# Patient Record
Sex: Female | Born: 1940 | Race: White | Hispanic: No | Smoking: Former smoker
Health system: Southern US, Community
[De-identification: ages and names within clinical notes are randomized; demographics above are authoritative.]

## PROBLEM LIST (undated history)

## (undated) DIAGNOSIS — E785 Hyperlipidemia, unspecified: Secondary | ICD-10-CM

## (undated) DIAGNOSIS — J449 Chronic obstructive pulmonary disease, unspecified: Secondary | ICD-10-CM

## (undated) DIAGNOSIS — E079 Disorder of thyroid, unspecified: Secondary | ICD-10-CM

## (undated) DIAGNOSIS — I10 Essential (primary) hypertension: Secondary | ICD-10-CM

## (undated) DIAGNOSIS — J189 Pneumonia, unspecified organism: Secondary | ICD-10-CM

## (undated) DIAGNOSIS — Z860100 Personal history of colon polyps, unspecified: Secondary | ICD-10-CM

## (undated) DIAGNOSIS — R600 Localized edema: Secondary | ICD-10-CM

## (undated) DIAGNOSIS — I219 Acute myocardial infarction, unspecified: Secondary | ICD-10-CM

## (undated) DIAGNOSIS — I714 Abdominal aortic aneurysm, without rupture, unspecified: Secondary | ICD-10-CM

## (undated) DIAGNOSIS — D649 Anemia, unspecified: Secondary | ICD-10-CM

## (undated) DIAGNOSIS — K5190 Ulcerative colitis, unspecified, without complications: Secondary | ICD-10-CM

## (undated) DIAGNOSIS — I2510 Atherosclerotic heart disease of native coronary artery without angina pectoris: Secondary | ICD-10-CM

## (undated) DIAGNOSIS — R609 Edema, unspecified: Secondary | ICD-10-CM

## (undated) DIAGNOSIS — Z9289 Personal history of other medical treatment: Secondary | ICD-10-CM

## (undated) DIAGNOSIS — K219 Gastro-esophageal reflux disease without esophagitis: Secondary | ICD-10-CM

## (undated) DIAGNOSIS — Z720 Tobacco use: Secondary | ICD-10-CM

## (undated) DIAGNOSIS — Z86718 Personal history of other venous thrombosis and embolism: Secondary | ICD-10-CM

## (undated) DIAGNOSIS — J45909 Unspecified asthma, uncomplicated: Secondary | ICD-10-CM

## (undated) DIAGNOSIS — M810 Age-related osteoporosis without current pathological fracture: Secondary | ICD-10-CM

## (undated) DIAGNOSIS — E039 Hypothyroidism, unspecified: Secondary | ICD-10-CM

## (undated) DIAGNOSIS — N189 Chronic kidney disease, unspecified: Secondary | ICD-10-CM

## (undated) HISTORY — DX: Abdominal aortic aneurysm, without rupture: I71.4

## (undated) HISTORY — PX: APPENDECTOMY: SHX54

## (undated) HISTORY — DX: Essential (primary) hypertension: I10

## (undated) HISTORY — DX: Pneumonia, unspecified organism: J18.9

## (undated) HISTORY — PX: ESOPHAGOGASTRODUODENOSCOPY: SHX1529

## (undated) HISTORY — PX: ORIF WRIST FRACTURE: SHX2133

## (undated) HISTORY — DX: Disorder of thyroid, unspecified: E07.9

## (undated) HISTORY — DX: Unspecified asthma, uncomplicated: J45.909

## (undated) HISTORY — PX: TONSILLECTOMY: SUR1361

## (undated) HISTORY — DX: Abdominal aortic aneurysm, without rupture, unspecified: I71.40

## (undated) HISTORY — PX: BREAST SURGERY: SHX581

## (undated) HISTORY — PX: COLONOSCOPY: SHX174

## (undated) HISTORY — PX: TUBAL LIGATION: SHX77

## (undated) HISTORY — DX: Hyperlipidemia, unspecified: E78.5

## (undated) HISTORY — PX: KNEE ARTHROSCOPY: SUR90

---

## 1999-03-14 ENCOUNTER — Other Ambulatory Visit: Admission: RE | Admit: 1999-03-14 | Discharge: 1999-03-14 | Payer: Self-pay | Admitting: Gastroenterology

## 1999-03-14 ENCOUNTER — Encounter (INDEPENDENT_AMBULATORY_CARE_PROVIDER_SITE_OTHER): Payer: Self-pay | Admitting: Specialist

## 2002-02-10 ENCOUNTER — Encounter: Payer: Self-pay | Admitting: Internal Medicine

## 2002-02-10 ENCOUNTER — Ambulatory Visit (HOSPITAL_COMMUNITY): Admission: RE | Admit: 2002-02-10 | Discharge: 2002-02-10 | Payer: Self-pay | Admitting: Internal Medicine

## 2003-03-14 ENCOUNTER — Other Ambulatory Visit: Admission: RE | Admit: 2003-03-14 | Discharge: 2003-03-14 | Payer: Self-pay | Admitting: Internal Medicine

## 2003-06-22 ENCOUNTER — Ambulatory Visit (HOSPITAL_COMMUNITY): Admission: RE | Admit: 2003-06-22 | Discharge: 2003-06-23 | Payer: Self-pay | Admitting: Orthopedic Surgery

## 2004-04-29 ENCOUNTER — Ambulatory Visit: Payer: Self-pay | Admitting: Internal Medicine

## 2004-05-06 ENCOUNTER — Ambulatory Visit: Payer: Self-pay | Admitting: Internal Medicine

## 2004-05-14 ENCOUNTER — Ambulatory Visit: Payer: Self-pay | Admitting: Internal Medicine

## 2004-05-24 ENCOUNTER — Ambulatory Visit: Payer: Self-pay | Admitting: Internal Medicine

## 2004-06-12 ENCOUNTER — Ambulatory Visit: Payer: Self-pay | Admitting: Internal Medicine

## 2004-06-17 ENCOUNTER — Ambulatory Visit: Payer: Self-pay | Admitting: Internal Medicine

## 2004-07-01 ENCOUNTER — Ambulatory Visit: Payer: Self-pay | Admitting: Internal Medicine

## 2005-07-08 ENCOUNTER — Ambulatory Visit: Payer: Self-pay | Admitting: Internal Medicine

## 2005-07-17 ENCOUNTER — Ambulatory Visit: Payer: Self-pay | Admitting: Internal Medicine

## 2005-08-06 ENCOUNTER — Ambulatory Visit: Payer: Self-pay | Admitting: Internal Medicine

## 2005-08-06 ENCOUNTER — Inpatient Hospital Stay (HOSPITAL_COMMUNITY): Admission: EM | Admit: 2005-08-06 | Discharge: 2005-08-08 | Payer: Self-pay | Admitting: Emergency Medicine

## 2005-08-15 ENCOUNTER — Ambulatory Visit: Payer: Self-pay | Admitting: Internal Medicine

## 2005-09-03 ENCOUNTER — Ambulatory Visit: Payer: Self-pay | Admitting: Gastroenterology

## 2005-09-15 ENCOUNTER — Ambulatory Visit: Payer: Self-pay | Admitting: Gastroenterology

## 2005-09-15 ENCOUNTER — Encounter (INDEPENDENT_AMBULATORY_CARE_PROVIDER_SITE_OTHER): Payer: Self-pay | Admitting: Specialist

## 2006-10-08 ENCOUNTER — Ambulatory Visit: Payer: Self-pay | Admitting: Internal Medicine

## 2006-10-08 LAB — CONVERTED CEMR LAB
ALT: 23 units/L (ref 0–35)
AST: 25 units/L (ref 0–37)
Albumin: 4.3 g/dL (ref 3.5–5.2)
Alkaline Phosphatase: 135 units/L — ABNORMAL HIGH (ref 39–117)
BUN: 12 mg/dL (ref 6–23)
Bacteria, UA: NEGATIVE
Basophils Absolute: 0 10*3/uL (ref 0.0–0.1)
Basophils Relative: 0 % (ref 0.0–1.0)
Bilirubin Urine: NEGATIVE
Bilirubin, Direct: 0.1 mg/dL (ref 0.0–0.3)
CO2: 28 meq/L (ref 19–32)
Calcium: 9.8 mg/dL (ref 8.4–10.5)
Chloride: 104 meq/L (ref 96–112)
Cholesterol: 202 mg/dL (ref 0–200)
Creatinine, Ser: 1 mg/dL (ref 0.4–1.2)
Crystals: NEGATIVE
Eosinophils Absolute: 0.1 10*3/uL (ref 0.0–0.6)
Eosinophils Relative: 2 % (ref 0.0–5.0)
GFR calc Af Amer: 72 mL/min
GFR calc non Af Amer: 59 mL/min
Glucose, Bld: 83 mg/dL (ref 70–99)
HCT: 41.9 % (ref 36.0–46.0)
HDL: 44.6 mg/dL (ref >39.0)
Hemoglobin, Urine: NEGATIVE
Hemoglobin: 14.2 g/dL (ref 12.0–15.0)
Ketones, ur: NEGATIVE mg/dL
LDL Cholesterol: 133 mg/dL — ABNORMAL HIGH (ref 0–99)
Leukocytes, UA: NEGATIVE
Lymphocytes Relative: 30.3 % (ref 12.0–46.0)
MCHC: 34 g/dL (ref 30.0–36.0)
MCV: 86.2 fL (ref 78.0–100.0)
Monocytes Absolute: 0.4 10*3/uL (ref 0.2–0.7)
Monocytes Relative: 6 % (ref 3.0–11.0)
Mucus, UA: NEGATIVE
Neutro Abs: 3.8 10*3/uL (ref 1.4–7.7)
Neutrophils Relative %: 61.7 % (ref 43.0–77.0)
Nitrite: NEGATIVE
Platelets: 217 10*3/uL (ref 150–400)
Potassium: 4 meq/L (ref 3.5–5.1)
RBC: 4.86 M/uL (ref 3.87–5.11)
RDW: 13.3 % (ref 11.5–14.6)
Sodium: 139 meq/L (ref 135–145)
Specific Gravity, Urine: <=1.005 (ref 1.000–1.03)
TSH: 5.23 microintl units/mL (ref 0.35–5.50)
Total Bilirubin: 0.6 mg/dL (ref 0.3–1.2)
Total CHOL/HDL Ratio: 4.5
Total Protein, Urine: 30 mg/dL — AB
Total Protein: 7.8 g/dL (ref 6.0–8.3)
Triglycerides: 122 mg/dL (ref 0–149)
Urine Glucose: NEGATIVE mg/dL
Urobilinogen, UA: 0.2 (ref 0.0–1.0)
VLDL: 24 mg/dL (ref 0–40)
WBC: 6.1 10*3/uL (ref 4.5–10.5)
pH: 6 (ref 5.0–8.0)

## 2006-10-14 ENCOUNTER — Encounter: Payer: Self-pay | Admitting: Internal Medicine

## 2006-10-14 ENCOUNTER — Ambulatory Visit: Payer: Self-pay | Admitting: Internal Medicine

## 2006-10-14 ENCOUNTER — Other Ambulatory Visit: Admission: RE | Admit: 2006-10-14 | Discharge: 2006-10-14 | Payer: Self-pay | Admitting: Internal Medicine

## 2006-10-14 DIAGNOSIS — M810 Age-related osteoporosis without current pathological fracture: Secondary | ICD-10-CM | POA: Insufficient documentation

## 2006-10-14 DIAGNOSIS — J45909 Unspecified asthma, uncomplicated: Secondary | ICD-10-CM | POA: Insufficient documentation

## 2006-10-14 LAB — CONVERTED CEMR LAB: Pap Smear: NORMAL

## 2006-12-10 ENCOUNTER — Encounter: Payer: Self-pay | Admitting: Internal Medicine

## 2007-09-22 ENCOUNTER — Telehealth: Payer: Self-pay | Admitting: Internal Medicine

## 2007-12-10 ENCOUNTER — Ambulatory Visit: Payer: Self-pay | Admitting: Internal Medicine

## 2007-12-10 LAB — CONVERTED CEMR LAB
ALT: 19 units/L (ref 0–35)
AST: 20 units/L (ref 0–37)
Albumin: 3.9 g/dL (ref 3.5–5.2)
Alkaline Phosphatase: 115 units/L (ref 39–117)
BUN: 17 mg/dL (ref 6–23)
Basophils Absolute: 0 10*3/uL (ref 0.0–0.1)
Basophils Relative: 0.2 % (ref 0.0–3.0)
Bilirubin Urine: NEGATIVE
Bilirubin, Direct: 0.1 mg/dL (ref 0.0–0.3)
CO2: 30 meq/L (ref 19–32)
Calcium: 9.7 mg/dL (ref 8.4–10.5)
Chloride: 100 meq/L (ref 96–112)
Cholesterol: 204 mg/dL (ref 0–200)
Creatinine, Ser: 1 mg/dL (ref 0.4–1.2)
Crystals: NEGATIVE
Direct LDL: 132.4 mg/dL
Eosinophils Absolute: 0.1 10*3/uL (ref 0.0–0.7)
Eosinophils Relative: 1.7 % (ref 0.0–5.0)
GFR calc Af Amer: 71 mL/min
GFR calc non Af Amer: 59 mL/min
Glucose, Bld: 70 mg/dL (ref 70–99)
HCT: 41.8 % (ref 36.0–46.0)
HDL: 42.9 mg/dL (ref >39.0)
Hemoglobin, Urine: NEGATIVE
Hemoglobin: 14.3 g/dL (ref 12.0–15.0)
Ketones, ur: NEGATIVE mg/dL
LDL Cholesterol: 146 mg/dL — ABNORMAL HIGH (ref 0–99)
Lymphocytes Relative: 27.8 % (ref 12.0–46.0)
MCHC: 34.3 g/dL (ref 30.0–36.0)
MCV: 85.9 fL (ref 78.0–100.0)
Monocytes Absolute: 0.4 10*3/uL (ref 0.1–1.0)
Monocytes Relative: 7.6 % (ref 3.0–12.0)
Neutro Abs: 3.5 10*3/uL (ref 1.4–7.7)
Neutrophils Relative %: 62.7 % (ref 43.0–77.0)
Nitrite: NEGATIVE
Platelets: 194 10*3/uL (ref 150–400)
Potassium: 4.1 meq/L (ref 3.5–5.1)
RBC / HPF: NONE SEEN
RBC: 4.86 M/uL (ref 3.87–5.11)
RDW: 12.8 % (ref 11.5–14.6)
Sodium: 138 meq/L (ref 135–145)
Specific Gravity, Urine: 1.01 (ref 1.000–1.03)
TSH: 2.51 microintl units/mL (ref 0.35–5.50)
Total Bilirubin: 0.6 mg/dL (ref 0.3–1.2)
Total CHOL/HDL Ratio: 4.8
Total Protein, Urine: 30 mg/dL — AB
Total Protein: 6.7 g/dL (ref 6.0–8.3)
Triglycerides: 78 mg/dL (ref 0–149)
Urine Glucose: NEGATIVE mg/dL
Urobilinogen, UA: 0.2 (ref 0.0–1.0)
VLDL: 16 mg/dL (ref 0–40)
WBC: 5.6 10*3/uL (ref 4.5–10.5)
pH: 6 (ref 5.0–8.0)

## 2007-12-16 ENCOUNTER — Ambulatory Visit: Payer: Self-pay | Admitting: Internal Medicine

## 2007-12-16 DIAGNOSIS — E785 Hyperlipidemia, unspecified: Secondary | ICD-10-CM | POA: Insufficient documentation

## 2007-12-16 DIAGNOSIS — I714 Abdominal aortic aneurysm, without rupture, unspecified: Secondary | ICD-10-CM | POA: Insufficient documentation

## 2007-12-16 DIAGNOSIS — F17200 Nicotine dependence, unspecified, uncomplicated: Secondary | ICD-10-CM

## 2007-12-20 ENCOUNTER — Encounter (INDEPENDENT_AMBULATORY_CARE_PROVIDER_SITE_OTHER): Payer: Self-pay | Admitting: *Deleted

## 2008-01-06 ENCOUNTER — Encounter: Admission: RE | Admit: 2008-01-06 | Discharge: 2008-01-06 | Payer: Self-pay | Admitting: Internal Medicine

## 2008-01-13 ENCOUNTER — Encounter: Payer: Self-pay | Admitting: Internal Medicine

## 2008-01-13 ENCOUNTER — Ambulatory Visit: Payer: Self-pay | Admitting: Internal Medicine

## 2008-02-21 ENCOUNTER — Telehealth: Payer: Self-pay | Admitting: Internal Medicine

## 2008-09-27 ENCOUNTER — Ambulatory Visit: Payer: Self-pay | Admitting: Internal Medicine

## 2009-07-16 ENCOUNTER — Ambulatory Visit: Payer: Self-pay | Admitting: Internal Medicine

## 2010-02-05 NOTE — Assessment & Plan Note (Signed)
Summary: SHOULDER PROBLEM/NWS   Vital Signs:  Patient profile:   70 year old female Height:      62 inches Weight:      123 pounds BMI:     22.58 O2 Sat:      94 % on Room air Temp:     98.6 degrees F oral Pulse rate:   65 / minute BP sitting:   138 / 66  (left arm) Cuff size:   regular  Vitals Entered By: Bill Salinas CMA (July 16, 2009 3:12 PM)  O2 Flow:  Room air CC: pt here with c/o pain in her left shoulder with difficulty lifting her left arm/ ab   Primary Care Provider:  Norins  CC:  pt here with c/o pain in her left shoulder with difficulty lifting her left arm/ ab.  History of Present Illness: two weeks of shoulder pain left. Does not recall any injury just starting having pain one morning with her usual stretch. No falls or starins. Tyhe pain has been getting worse. No paresthesia or weakness in the arm. She is taking  advil several times a day which does help.   Current Medications (verified): 1)  Furosemide 40 Mg Tabs (Furosemide) .Marland Kitchen.. 1 By Mouth Once Daily For Bp 2)  Klor-Con M20 20 Meq  Tbcr (Potassium Chloride Crys Cr) .... Once Daily 3)  Zyrtec Allergy 10 Mg  Tabs (Cetirizine Hcl) .... Once Daily As Needed 4)  Levothyroxine Sodium 50 Mcg  Tabs (Levothyroxine Sodium) .... Once Daily 5)  Simvastatin 20 Mg Tabs (Simvastatin) .Marland Kitchen.. 1 By Mouth Qpm 6)  Boniva 150 Mg  Tabs (Ibandronate Sodium) .... Monthly 7)  Amlodipine Besylate 10 Mg Tabs (Amlodipine Besylate) .Marland Kitchen.. 1 By Mouth Once Daily 8)  Promethazine-Codeine 6.25-10 Mg/58ml Syrp (Promethazine-Codeine) .Marland Kitchen.. 1 Tsp Q 6 As Needed Cough  Allergies (verified): 1)  ! Sulfa PMH-FH-SH reviewed-no changes except otherwise noted  Review of Systems  The patient denies anorexia, weight loss, weight gain, chest pain, dyspnea on exertion, peripheral edema, difficulty walking, depression, and enlarged lymph nodes.   MS:  Complains of joint pain and stiffness; denies joint redness, joint swelling, loss of strength, muscle  aches, cramps, and muscle weakness.  Physical Exam  General:  Well-developed,well-nourished,in no acute distress; alert,appropriate and cooperative throughout examination Msk:  left shoulder with decrease ROM with extension rotation, extreme of flexion. Cannot reach behind her neck or reach her back in a bra-fastening movement. No crepitus to the movement of the shoulder. Normal grip strength, normal sensation.    Impression & Recommendations:  Problem # 1:  BURSITIS, LEFT SHOULDER (ICD-726.10)  symptoms are suggestive of bursitis.  Plan - intra-articular steroid injection.  After injection - rapid improvement in ROM and decreased pain.   Orders: Joint Aspirate / Injection, Large (20610) Depo- Medrol 40mg  (J1030)  Complete Medication List: 1)  Furosemide 40 Mg Tabs (Furosemide) .Marland Kitchen.. 1 by mouth once daily for bp 2)  Klor-con M20 20 Meq Tbcr (Potassium chloride crys cr) .... Once daily 3)  Zyrtec Allergy 10 Mg Tabs (Cetirizine hcl) .... Once daily as needed 4)  Levothyroxine Sodium 50 Mcg Tabs (Levothyroxine sodium) .... Once daily 5)  Simvastatin 20 Mg Tabs (Simvastatin) .Marland Kitchen.. 1 by mouth qpm 6)  Boniva 150 Mg Tabs (Ibandronate sodium) .... Monthly 7)  Amlodipine Besylate 10 Mg Tabs (Amlodipine besylate) .Marland Kitchen.. 1 by mouth once daily 8)  Promethazine-codeine 6.25-10 Mg/69ml Syrp (Promethazine-codeine) .Marland Kitchen.. 1 tsp q 6 as needed cough   Preventive Care Screening  Last Flu Shot:    Date:  07/16/2009    Results:  Declined  Bone Density:    Date:  01/13/2008    Results:  abnormal std dev    Procedure Note  Injections: The patient complains of pain and inflammation. Indication: acute pain  Procedure # 1: joint injection    Region: lateral    Location: left shoulder    Technique: 23g 5/8s needle    Medication: 40 mg depomedrol    Anesthesia: 2% xylocain    Comment: verbal consent obtained. Entered joint space first pass. No complications. Rapid relief of pain  Cleaned and  prepped with: betadine Wound dressing: bandaid

## 2010-04-11 ENCOUNTER — Other Ambulatory Visit: Payer: Self-pay | Admitting: Internal Medicine

## 2010-05-24 NOTE — Assessment & Plan Note (Signed)
[facility]                             PRIMARY CARE OFFICE NOTE   NAME:STOVALLMackynzie, Woolford                    MRN:          [id number]  DATE:08/15/2005                            DOB:          [birth year]    Ms. Zappia was recently in the hospital August 1 to August 3 with  intractable nausea, vomiting and copious watery diarrhea.  Working diagnosis  at that time was infectious colitis.  She was C. diff toxin negative x2.  Patient had a leukocytosis of 17,700.  Patient was treated with  Ciprofloxacin.  She was able to advance to a full diet.  Her last CBC  revealed a white count 12,000 and she was markedly improved.   Since hospital discharge, the patient reports she has been able to take a  regular diet, although not eating as much as she usually eats.  She has had  four loose stools a day but has seen no blood in the stool which was part of  her presenting symptoms in hospital and stools are not watery, they are just  semi-formed.  She continues to have some mild abdominal discomfort.   Patient was also treated for a UTI when hospitalized with same antibiotic,  Ciprofloxacin.  She has had no urinary frequency or problems at this time.   CURRENT MEDICATIONS:  1. Lasix 20 mg daily.  2. Potassium 20 mEq daily.  3. Zyrtec 10 mg daily.  4. Levothyroxine 50 micrograms daily.  5. Caduet 10/10 once daily.  6. Boniva 150 mg monthly.   EXAMINATION:  VITAL SIGNS:  Temperature was 98.9.  Blood pressure 105/67.  Heart rate is 66.  Weight is 123.  GENERAL APPEARANCE:  A well-nourished, well-groomed woman in no acute  distress.  ABDOMEN:  Patient had bowel sounds in all four quadrants.  She had no  organosplenomegaly.  She has some mild lower abdominal protuberance  She had  tenderness to palpation, worse in the right lower quadrant, also tender in  the left lower quadrant.  She had mild rebound.   ASSESSMENT/PLAN:  Gastrointestinal - patient with  resolving infectious  colitis still with four loose stools daily.  No fevers.  She is able to take  a diet.  She has had no blood or mucus in her stool.   PLAN:  1. CBC with differential today to make sure her leukocytosis is completely      resolved.  2. Patient is also set up to see Dr. Claudette Head for colonoscopy on      September 15, 2005 at 9:30 a.m. with a preoperative visit August 29.      The patient is aware. The purpose of this study is to follow up for      infectious colitis to rule out any colonic or persistent inflammatory      changes.  Patient's last full colonoscopy for reason of screening      purposes was December 08, 2002 and this was a normal study, except for      diverticulosis.  3. Genitourinary:  The patient had a urinary tract infection.  She is      doing well at this time with no frequency or urgency or dysuria.  Plan      a followup urinalysis to insure that the infection is cleared.  4. Hypothyroid disease:  The patient is on Levothyroxine.  While she was      in hospital she had still mildly elevated TSH.  We will go ahead and      recheck her TSH today in a healthy state and adjust her medications as      indicated to obtain good control.                                   Rosalyn Gess Norins, MD   MEN/MedQ  DD:  08/15/2005  DT:  08/15/2005  Job #:  [id number]   cc:   Venita Lick. Pleas Koch., MD, [facility]  [place] Pellant

## 2010-05-24 NOTE — H&P (Signed)
NAMELUKE, RIGSBEE             ACCOUNT NO.:  [id number]   MEDICAL RECORD NO.:  [id number]          PATIENT TYPE:  INP   LOCATION:  1823                         FACILITY:  [facility]   PHYSICIAN:  Barbette Hair. Artist Pais, DO      DATE OF BIRTH:  01-11-1940   DATE OF ADMISSION:  08/06/2005  DATE OF DISCHARGE:                                HISTORY & PHYSICAL   CHIEF COMPLAINT:  Intractable nausea, vomiting, and diarrhea.   HISTORY OF PRESENT ILLNESS:  Patient is a 70 year old white female with past  medical history of hypertension, hyperlipidemia, recently started on  Synthroid for hypothyroidism, here for nausea, vomiting, and diarrhea since  Monday.  Patient describes loose, watery bowel movements every 30 minutes  since Monday.  Patient complains of associated weakness and dizziness.  She  denies any sick contacts and no unusual food intake over the weekend.  Patient has not been on any antibiotics recently.   She also was somewhat concerned that she noticed some blood in her stools  this a.m.  She does not describe a large or voluminous rectal bleeding,  instead a small amount of bleeding that she notes on her tissue and a small  amount mixed with stools.  She has experienced some abdominal pain,  especially in her lower quadrant, left and right.   PAST MEDICAL HISTORY SUMMARY:  1. Hypertension.  2. Dyslipidemia.  3. Osteoporosis on bisphosphonate therapy.  4. Status post appendectomy.  5. Status post tubal ligation.  6. History of left knee arthroscopy.   CURRENT MEDICATIONS:  1. Lasix 20 mg once a day.  2. Zyrtec 10 mg once a day.  3. Levothyroxine 50 mcg once a day.  4. Caduet 10/10 1 a day.  5. Boniva 150 mg q. Monthly.   ALLERGY TO MEDICATION:  INCLUDE SULFA.   SOCIAL HISTORY:  Patient married 37 years; widowed since 1999; has 2 sons, 1  daughter.  Patient continues to work.  Possible history of tobacco abuse.  No alcohol.   FAMILY HISTORY:  Father died at age 35 secondary  to MI; mother decreased at  age 59 secondary to MI; and maternal aunt with history of breast cancer.   REVIEW OF SYSTEMS:  As noted above.  Patient denies any chest pain or  shortness of breath.  All other systems negative.   PHYSICAL EXAMINATION:  VITAL SIGNS:  Weight is 118 pounds.  Temperature is  97, pulse is 82.  BP is 147/89 sitting; 140/80 standing.  Review of her  recent records shows that on July 12 she weighed 123 pounds.  GENERAL:  The patient is a pleasant, thin 70 year old white female who  appears somewhat lethargic but no apparent distress.  HEENT:  Normocephalic, atraumatic.  Pupils are equal and reactive to light  bilaterally.  Extraocular muscles intact.  The patient is anicteric.  Conjunctivae was within normal limits.  External auditory canal and tympanic  membranes were clear bilaterally.  Mucous membranes were moist.  NECK:  Neck was supple.  No adenopathy, carotid bruit, or thyromegaly.  CHEST EXAM:  Normal respiratory effort.  Chest is  clear to auscultation  bilaterally.  No rhonchi, rales or wheezing.  CARDIOVASCULAR:  Regular rate and rhythm.  No significant murmurs, rubs, or  gallops appreciated.  ABDOMEN:  Abdomen was soft.  Patient had mild lower abdominal distention.  She did have some epigastric tenderness, as well as tenderness in her lower  abdomen and suprapubic area.  NEUROLOGIC:  Cranial nerves II-XII were grossly intact.  She was nonfocal.   IMPRESSIONS/RECOMMENDATIONS:  1. Intractable nausea, vomiting, with probable dehydration.  2. Diarrhea.  Unclear etiology, likely infectious.  3. Hypertension.  4. History of fatigue and elevated TSH.  5. History of osteoporosis.   RECOMMENDATIONS:  Patient will be admitted for observation and IV fluids.  We will provide antiemetics, check complete metabolic profile as well as  amylase and lipase and urinalysis.  We will send stool culture and also  stool for WBCs as well as C. diff.   If her abdominal  pain continues and kidney function is normal, we may  consider a CAT scan of her abdomen and pelvis.      Barbette Hair. Artist Pais, DO  Electronically Signed     RDY/MEDQ  D:  08/06/2005  T:  08/06/2005  Job:  [id number]   cc:   Rosalyn Gess. Norins, MD

## 2010-05-24 NOTE — Discharge Summary (Signed)
Angelica Huynh, Angelica Huynh             ACCOUNT NO.:  [id number]   MEDICAL RECORD NO.:  [id number]          PATIENT TYPE:  INP   LOCATION:  5016                         FACILITY:  [facility]   PHYSICIAN:  Rosalyn Gess. Norins, M.D. LHCDATE OF BIRTH:  11/14/1940   DATE OF ADMISSION:  08/06/2005  DATE OF DISCHARGE:  08/08/2005                                 DISCHARGE SUMMARY   ADMITTING DIAGNOSES:  1.  Diarrhea, intractable nausea and vomiting.  2.  Hypertension.  3.  Fatigue with mildly elevated TSH.  4.  Osteoporosis.   DISCHARGED DIAGNOSES:  1.  Infectious colitis, improved.  2.  Urinary tract infection, improved.  3.  Nausea and vomiting, resolved.  4.  Diarrhea, resolved.  5.  Hypothyroid disease with mild elevation in TSH.   HISTORY OF PRESENT ILLNESS:  Patient is a 70 year old Caucasian female with  a history of hypertension and hyperlipidemia, recently started on Synthroid  for hypothyroid disease who presents with nausea, vomiting and diarrhea for  several days.  She describes loose watery bowel movements every 30 minutes  for at least 2 days.  The patient complains of associated weakness and  dizziness.  The patient also noticed a scant amount of blood in her stool.  Because of these symptoms, the patient was admitted to the hospital.  Of  note, the patient had minimal orthostasis at the time of admission with a  blood pressure of 140/80 standing, 147/89 sitting.  The patient was down in  weight 5 pounds.   Admission examination was significant for the vital signs as noted.  Abdomen  was soft.  She had mild lower abdominal distension and discomfort.  She has  some epigastric tenderness as well.   HOSPITAL COURSE:  1.  GASTROINTESTINAL:  The patient was found to have watery greenish stool.      She had C.  Difficile toxin negative x1.  Second C diff was pending at      time of discharge.  The patient did have a positive urinalysis, urine      culture was pending at time of  discharge.  The patient was found to have      a significant leukocytosis at 17,700.  It was presumed the patient had      an infectious colitis.  She was started on ciprofloxacin to cover both      the urinary tract infection and no GI pathology.  On this regimen, the      patient's symptoms abated.  She had no diarrhea for 18 hours prior to      discharge.  Her nausea and vomiting have resolved.  Her abdominal      discomfort was markedly improved.  She has been able to take a diet.      Given the patient's marked improvement, given that her white count has      come down from 17.7 to 12,000, she is felt to be stable and able to be      discharged home on oral antibiotics.  2.  Urinary tract infection.  As above, the patient had a positive  urinalysis.  Culture is pending.  She is improved on Cipro with      decreased urinary frequency and urgency.  The patient will be able      continue oral antibiotics as an outpatient.  3.  Dehydration.  The patient has taken IV fluids and done well.  Blood      pressure remained stable.  She on taking oral fluids and food.  4.  Hypothyroid disease.  Patient with mildly elevated TSH.  She has been on      medication for several weeks.  We will adjust her medications as an      outpatient.   DISCHARGE EXAMINATION:  VITAL SIGNS:  Temperature of 98.6, blood pressure  107/69, heart rate 76, respirations 16, O2 sats 94%.  GENERAL APPEARANCE:  This a pleasant woman in no acute distress.  ABDOMEN:  The patient had positive bowel sounds in all four quadrants.  There is no guarding, rebound or tenderness to deep palpation.  No further  examination conducted.   DISCHARGE MEDICATIONS:  The patient will continue on her home medications  including:  1.  Lasix 20 mg q.a.m.  2.  Zyrtec 10 mg q.d.  3.  Levothyroxine 50 mcg daily.  4.  Caduet 10/10 once daily.  5.  Boniva 150 mg monthly.   DISPOSITION:  The patient is discharged home.  She will be seen in  the  office in follow-up in approximately one week.   The patient's condition at time of discharge dictation is stable and  improved.           ______________________________  Rosalyn Gess Norins, M.D. [facility]     MEN/MEDQ  D:  08/08/2005  T:  08/08/2005  Job:  [id number]

## 2010-05-24 NOTE — Assessment & Plan Note (Signed)
[facility]                             PRIMARY CARE OFFICE NOTE   NAME:STOVALLCoralyn, Roselli                    MRN:          [id number]  DATE:07/17/2005                            DOB:          November 14, 1940    PRIMARY CARE OFFICE NOTE:  Angelica Huynh is a very pleasant 70 year old woman  who presents for followup evaluation and exam.  She was last seen May of  2006.   INTERVAL HISTORY:  Angelica Huynh reports she has had some onset of fatigue.  She  does report awakening several times during the night but does return to  sleep without difficulty.  She has been able to keep up her normal level of  activity.   PAST SURGICAL HISTORY:  1.  Appendectomy.  2.  Tubal ligation.  3.  Left knee arthroscopy.  4.  Operative repair and internal fixation of a fractured left wrist.   MEDICAL ILLNESSES:  1.  Usual childhood diseases.  2.  Pneumonia as a child.  3.  History of asthma and hay fever - currently in remission.  4.  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  5.  History of asthmatic bronchitis.  6.  History of hypertension.  7.  Lumpectomy on the right in the 70's or 80's. It was benign.  8.  Osteoporosis with T-scores less than minus 2.  Worse at the PA spine at      minus 3.75.   CURRENT MEDICATIONS:  1.  Lasix 20 mg daily.  2.  Norvasc 10 mg daily.  3.  Lipitor 10 mg daily.  4.  Potassium 20 mEq daily.  5.  Zyrtec 10 mg daily.  6.  Boniva 150 mg monthly.   FAMILY HISTORY:  Father died of an MI at age 3.  Mother died at age 68 of  an MI.  Brother died at age 11 of an MI.  Angelica Huynh's mother also had cervical  cancer.  She had a maternal aunt with breast cancer.   SOCIAL HISTORY:  Angelica Huynh was married 37 years.  Has been a widow since 1999.  She has two sons, one daughter, five grandchildren.  Angelica Huynh does continue  to work.   REVIEW OF SYSTEMS:  Angelica Huynh had no fevers or chills.  She has had some  weight gain and currently is on  a 1200 calorie diet.  Her goal weight is 115  to 120.  Angelica Huynh has not had an eye exam for greater than 12 months.  She  has had no ENT complaints, no cardiovascular complaints.  Respiratory, the  Angelica Huynh does continue to smoke.  She does have an a.m. cough productive of  less than 1 teaspoon of sputum.  She does have some mild shortness of breath  but has no limitation in her activities.  GI significant for occasional  heartburn. The Angelica Huynh does take Pepcid on an as needed basis.  No GU or  musculoskeletal complaints.   HEALTH MAINTENANCE:  Angelica Huynh's last colonoscopy was in 2004.  Last DEXA scan  was May of 2006.  Last ultrasound for abdomen was May  of 2006 in which she  had a stable aortic aneurysm that was 4 x 4.5 cm.  Last mammogram on the  chart is from January 2006 which was a normal study.   PHYSICAL EXAMINATION:  VITAL SIGNS:  Temperature was 98.4.  Blood pressure  125/67.  Pulse 67.  Weight 123.  GENERAL APPEARANCE:  A well nourished, well developed woman looking younger  than her stated age in no acute distress.  HEENT:  Normocephalic, atraumatic.  Angelica Huynh has cerumen impaction on the  right which was irrigated successfully.  EAC and TM on the left was normal.  Oropharynx with no buccal lesions.  Tongue was normal.  Posterior pharynx  was clear.  Conjunctiva and sclerae were clear.  Pupils equal, round and  reactive.  Funduscopic exam was unremarkable.  NECK:  Supple without thyromegaly.  Nodes: no adenopathy was noted at  submandibular supraclavicular axillary regions.  CHEST:  Angelica Huynh had no CVA tenderness.  LUNGS:  Angelica Huynh is moving air well.  Does have end-expiratory wheezes right  and left.  BREASTS:  Skin was normal. Nipples without discharge.  No fixed mass lesion  or abnormality was appreciated.  CARDIOVASCULAR:  2+ radial pulse.  No JVD or chronic bruits.  Had a quite  precordium with a regular rate and rhythm without murmurs, rubs or gallops.  ABDOMEN:  Soft, no  guarding or rebound.  There was no organosplenomegaly  appreciated.  PELVIC:  Exam deferred with last exam of March of 2005.  EXTREMITIES:  Without clubbing, cyanosis, edema or deformities.  NEUROLOGIC:  Exam was not focal.  SKIN:  Skin was clear.   DATA BASE:  Hemoglobin was 13.9 grams.  White count was 5,400 with a normal  differential.  Chemistries were unremarkable with a blood sugar of 105.  Kidney function normal with a creatinine of 1.1.  Liver functions were  normal.  Thyroid function was abnormal with a  TSH that was elevated at 6.88.  Cholesterol is 160.  Triglycerides 86.  HDL  was 37.9.  LDL was 105.   ASSESSMENT AND PLAN:  1.  Hypertension.  Angelica Huynh's blood pressure is well controlled on her      present medication and she will continue the same.  2.  This Angelica Huynh has an excellent response to Lipitor with an LDL that      definitely is at goal.  Angelica Huynh will continue the same but we will      consolidate her medications to Caduet 10/10.  3.  Fatigue and malaise.  Weight gain.  Angelica Huynh does have mildly elevated      TSH suggestive of hypothyroid disease.  This could be playing a role in      the Angelica Huynh's energy level and weight problems.   PLAN:  1.  We will start the Angelica Huynh on Levothyroxine 50 micrograms daily.  Angelica Huynh      will need followup laboratory in six weeks.  2.  Allergy - Angelica Huynh is stable.  3.  Smoking cessation - Angelica Huynh does continue to smoke at 1 carton every two      weeks.  She does have a cough, she does have some wheezing.  Angelica Huynh      will have a PA and lateral chest x-ray.  I have adamantly encouraged the      Angelica Huynh to consider smoking cessation.  I've provided her with the 1-800-      QUIT NOW number and will be glad to assist her in this endeavor.  4.  Bone health - Angelica Huynh is on Boniva.  She would be a candidate for a      followup DEXA scan in 2008.   SUMMARY:  Angelica Huynh with multiple medical problems outlined above.  Currently stable.  I  would like her to stop smoking.  She will need to return for  laboratory in six weeks as noted.                                   Rosalyn Gess Norins, MD   MEN/MedQ  DD:  07/18/2005  DT:  07/18/2005  Job #:  [id number]   cc:   [place] Pellant

## 2010-05-24 NOTE — Op Note (Signed)
NAMERYIN, SCHILLO                       ACCOUNT NO.:  [id number]   MEDICAL RECORD NO.:  [id number]                   PATIENT TYPE:  OIB   LOCATION:  2899                                 FACILITY:  [facility]   PHYSICIAN:  Dionne Ano. Everlene Other, M.D.         DATE OF BIRTH:  04-26-1940   DATE OF PROCEDURE:  06/22/2003  DATE OF DISCHARGE:                                 OPERATIVE REPORT   PREOPERATIVE DIAGNOSIS:  Comminuted complex left distal radius fracture with  gross angulation and displacement and metaphyseal comminution.   POSTOPERATIVE DIAGNOSIS:  Comminuted complex left distal radius fracture  with gross angulation and displacement and metaphyseal comminution.   PROCEDURE:  1. Left wrist open reduction and internal fixation with [employer]     plate and screw construct.  2. Stress radiography left wrist.  3. Bone graft with Allomatrix bone graft from a dorsal approach filling the     dorsal V defect.  4. Left posterior interosseous neurectomy.  5. Left EPL tendon sheath incision and transposition of the EPL tendon.   SURGEON:  Dionne Ano. Amanda Pea, M.D.   ASSISTANT:  Karie Chimera, P.A.-C.   COMPLICATIONS:  None.   ANESTHESIA:  General.   DRAINS:  One.   TOURNIQUET TIME:  Less than an hour.   INDICATIONS FOR PROCEDURE:  This patient is a very pleasant 70 year old  female who presents with the above mentioned diagnosis.  I have counseled  her in regards to the risks and benefits of surgery including the risks of  infection, bleeding, anesthesia, damage to normal structures, and failure of  surgery to accomplish its intended goals of relieving symptoms.  With this  in mind, she desires to proceed.  All questions were encouraged and answered  preoperatively.  I have discussed the specific risks of dystrophy,  stiffness, loss of motion, infection, and other complicating features with  the surgery.   PROCEDURE IN DETAIL:  The patient was seen by myself and  anesthesia, taken  to the operative suite, underwent preoperative antibiotics, placed supine,  appropriately padded, prepped and draped in the usual sterile fashion.  Following this, the arm was isolated and the tourniquet was inflated to 250  mmHg.  A manipulative reduction was performed without difficulty about the  wrist.  Once this was done, the patient then had a volar radial approach  made.  The volar radial approach was accomplished without difficulty  utilizing the skin incision based over the FCR.  Dissection was carried  down.  Hemostasis was obtained with bipolar electrocautery.  The FCR tendon  sheath was incised palmarly and dorsally.  It was swept radial and carpal  canal contents were swept ulnarly.  Following this, the pronator quadratus  was taken off in an L-shaped fashion in a radial to ulnar direction.  Retractors were placed.  Following this, adjustments in the reduction were  accomplished to recreate the volar tilt, radial height and length.  Following this,  application of a [employer] distal volar radius plate  was performed.  This plate was applied without difficulty utilizing standard  AO technique.  All screws had excellent purchase.  The distal portion of  plate utilized smooth pegs.  I checked the lengths and made sure all looked  quite well.  I was pleased with this and following this and repair of the  pronator quadratus and noted that the patient had a large dorsal V defect.  At this time, I made a 1 inch incision based over Listers tubercle,  dissected down and removed the posterior interosseous nerve with crushing  and cauterization technique.  I opened the EPL tendon sheath and transposed  the EPL which did have hematoma and some fragments from the bone encroaching  around it.  Once the EPL tendon was transposed and tendon sheath  decompressed, I then accessed the dorsal V defect and placed a large amount  (5 mL) of Allomatrix bone graft from [facility].  This bone graft was  placed in the dorsal V defect without difficulty.  This occupied the  comminuted area quite nicely.  Following this, final copy x-rays were taken.  The patient was placed through a range of motion, had good stability, and no  complications.   I should note that the tourniquet time was less than an hour.  The  tourniquet was briefly up for approximately 25 to 30 minutes followed by  deflation time and a reinsufflation for a dorsal approach which was less  than half hour, as well.  The patient did have a significant venous  tourniquet and did have some petechiae on the forearm.  The patient did not  have any other generalized drug reaction on observing the right upper  extremity.  We will continue to monitor this.  The patient had excellent  refill, soft compartments, and no signs of excessive swelling, dystrophy, or  other complicating features at the conclusion of the case.  We did place a  TLS drain in the volar wound and closed the pronator with Vicryl and closed  the subcu with Vicryl followed by interrupted Prolene at the skin edge.  She  tolerated this well and there were no complicating features.  The dorsal  approach was closed with Prolene and one subcu Vicryl.  The patient had a  sterile dressing applied, short arm splint placed, and was taken to the  recovery room after extubation.  She will be monitored closely.  I have  discussed with the family all details, etc., and all questions have been  encouraged and answered.  It has been a pleasure to participate in her care  and look forward to participating in her postoperative recovery.  She will  be admitted for IV antibiotics, pain medicine, observation, elevation, and  general postop measures including OT consult for range of motion and edema  control.                                               Dionne Ano. Everlene Other, M.D.    Nash Mantis  D:  06/22/2003  T:  06/23/2003  Job:  [id number]

## 2010-06-23 ENCOUNTER — Other Ambulatory Visit: Payer: Self-pay | Admitting: Internal Medicine

## 2010-08-07 ENCOUNTER — Encounter: Payer: Self-pay | Admitting: Internal Medicine

## 2010-10-18 ENCOUNTER — Encounter: Payer: Self-pay | Admitting: Internal Medicine

## 2010-10-21 ENCOUNTER — Ambulatory Visit (INDEPENDENT_AMBULATORY_CARE_PROVIDER_SITE_OTHER): Payer: Medicare Other | Admitting: Internal Medicine

## 2010-10-21 ENCOUNTER — Other Ambulatory Visit (INDEPENDENT_AMBULATORY_CARE_PROVIDER_SITE_OTHER): Payer: Medicare Other

## 2010-10-21 ENCOUNTER — Ambulatory Visit (INDEPENDENT_AMBULATORY_CARE_PROVIDER_SITE_OTHER)
Admission: RE | Admit: 2010-10-21 | Discharge: 2010-10-21 | Disposition: A | Discharge status: Home / Self Care | Payer: Medicare Other | Source: Ambulatory Visit | Attending: Internal Medicine | Admitting: Internal Medicine

## 2010-10-21 VITALS — BP 136/82 | HR 84 | Temp 98.0°F | Wt 120.0 lb

## 2010-10-21 DIAGNOSIS — I714 Abdominal aortic aneurysm, without rupture: Secondary | ICD-10-CM

## 2010-10-21 DIAGNOSIS — Z136 Encounter for screening for cardiovascular disorders: Secondary | ICD-10-CM

## 2010-10-21 DIAGNOSIS — E785 Hyperlipidemia, unspecified: Secondary | ICD-10-CM

## 2010-10-21 DIAGNOSIS — E039 Hypothyroidism, unspecified: Secondary | ICD-10-CM

## 2010-10-21 DIAGNOSIS — I10 Essential (primary) hypertension: Secondary | ICD-10-CM

## 2010-10-21 DIAGNOSIS — M810 Age-related osteoporosis without current pathological fracture: Secondary | ICD-10-CM

## 2010-10-21 DIAGNOSIS — Z0000 Encounter for general adult medical examination without abnormal findings: Secondary | ICD-10-CM

## 2010-10-21 DIAGNOSIS — Z23 Encounter for immunization: Secondary | ICD-10-CM

## 2010-10-21 DIAGNOSIS — G43809 Other migraine, not intractable, without status migrainosus: Secondary | ICD-10-CM

## 2010-10-21 DIAGNOSIS — F17200 Nicotine dependence, unspecified, uncomplicated: Secondary | ICD-10-CM

## 2010-10-21 DIAGNOSIS — G43109 Migraine with aura, not intractable, without status migrainosus: Secondary | ICD-10-CM

## 2010-10-21 DIAGNOSIS — J45909 Unspecified asthma, uncomplicated: Secondary | ICD-10-CM

## 2010-10-21 LAB — LIPID PANEL
Cholesterol: 178 mg/dL (ref 0–200)
LDL Cholesterol: 99 mg/dL (ref 0–99)
Triglycerides: 143 mg/dL (ref 0.0–149.0)

## 2010-10-21 LAB — HEPATIC FUNCTION PANEL
Alkaline Phosphatase: 110 U/L (ref 39–117)
Bilirubin, Direct: 0.1 mg/dL (ref 0.0–0.3)
Total Bilirubin: 0.6 mg/dL (ref 0.3–1.2)

## 2010-10-21 LAB — COMPREHENSIVE METABOLIC PANEL
BUN: 28 mg/dL — ABNORMAL HIGH (ref 6–23)
CO2: 23 mEq/L (ref 19–32)
Calcium: 9.7 mg/dL (ref 8.4–10.5)
Chloride: 102 mEq/L (ref 96–112)
Creatinine, Ser: 1.5 mg/dL — ABNORMAL HIGH (ref 0.4–1.2)
GFR: 36.83 mL/min — ABNORMAL LOW (ref >60.00)
Glucose, Bld: 81 mg/dL (ref 70–99)

## 2010-10-21 LAB — CBC WITH DIFFERENTIAL/PLATELET
Eosinophils Absolute: 0 10*3/uL (ref 0.0–0.7)
Eosinophils Relative: 0.6 % (ref 0.0–5.0)
MCV: 87.6 fl (ref 78.0–100.0)
Monocytes Absolute: 0.4 10*3/uL (ref 0.1–1.0)
Neutrophils Relative %: 64.7 % (ref 43.0–77.0)
Platelets: 242 10*3/uL (ref 150.0–400.0)
WBC: 6.4 10*3/uL (ref 4.5–10.5)

## 2010-10-21 MED ORDER — ZOSTER VACCINE LIVE 19400 UNT/0.65ML ~~LOC~~ SOLR: 0.6500 mL | Freq: Once | SUBCUTANEOUS | Status: DC

## 2010-10-22 ENCOUNTER — Encounter: Payer: Self-pay | Admitting: Internal Medicine

## 2010-10-22 DIAGNOSIS — G43109 Migraine with aura, not intractable, without status migrainosus: Secondary | ICD-10-CM | POA: Insufficient documentation

## 2010-10-22 DIAGNOSIS — Z0000 Encounter for general adult medical examination without abnormal findings: Secondary | ICD-10-CM | POA: Insufficient documentation

## 2010-10-22 NOTE — Assessment & Plan Note (Signed)
last DXA 3+ years ago. On bisphosphonates.   Plan - repeat DXA scan with recommendations to follow.

## 2010-10-22 NOTE — Assessment & Plan Note (Signed)
Has cut back but continues to smoke. Discussed smoking cessation strategy  Plan - smoker's diary           Return for cessation counseling in 3 weeks.  (greater than 20% of 60 minute visit spent on education and counseling)

## 2010-10-22 NOTE — Assessment & Plan Note (Signed)
Asymptomatic. Tolerating medications well.  Plan - continue present medications.

## 2010-10-22 NOTE — Progress Notes (Signed)
Subjective:    Patient ID: Angelica Huynh, female    DOB: 30-Jan-1940, 70 y.o.   MRN: [id number]  HPI The patient is here for annual Medicare wellness examination and management of other chronic and acute problems. In the interval since her last visit in '09 she reports increased DOE over the past year. Her symptoms resolve after 20 min of rest. She does continue to smoke but is down to 7 cigarettes a day.  She denies chest pain, light-headedness of nausea with her DOE. She has an occasional AM cough productive of a whitish sputum.No weight loss, no fatigue or orthopnea.   She does have a h/o AAA. In addition her father, her brother and her half-brother all died of ruptured AA   The risk factors are reflected in the social history.  The roster of all physicians providing medical care to patient - is listed in the Snapshot section of the chart.  Activities of daily living:  The patient is 100% inedpendent in all ADLs: dressing, toileting, feeding as well as independent mobility  Home safety : The patient has smoke detectors in the home. They wear seatbelts. No firearms at home  There is no risks for hepatitis, STDs or HIV. There is no   history of blood transfusion. They have no travel history to infectious disease endemic areas of the world.  The patient has  seen their dentist in the last six month. They have seen their eye doctor in the last year. They deny any hearing difficulty and have not had audiologic testing in the last year.  They do not  have excessive sun exposure. Discussed the need for sun protection: hats, long sleeves and use of sunscreen if there is significant sun exposure.   Diet: the importance of a healthy diet is discussed. They do have a healthy  diet.  The patient has no regular aerobic exercise program. The benefits of regular aerobic exercise were discussed.  Depression screen: there are no signs or vegative symptoms of depression- irritability, change in appetite,  anhedonia, sadness/tearfullness.  Cognitive assessment: the patient manages all their financial and personal affairs and is actively engaged.   The following portions of the patient's history were reviewed and updated as appropriate: allergies, current medications, past family history, past medical history,  past surgical history, past social history  and problem list.  Vision, hearing, body mass index were assessed and reviewed.   During the course of the visit the patient was educated and counseled about appropriate screening and preventive services including : fall prevention , diabetes screening, nutrition counseling, colorectal cancer screening, and recommended immunizations.  Past Medical History  Diagnosis Date  . Pneumonia     Childhood  . Asthma     In remission  . Asthmatic bronchitis   . HTN (hypertension)   . Osteoporosis    Past Surgical History  Procedure Date  . Appendectomy   . Tubal ligation   . Knee arthroscopy   . Orif wrist fracture     LEFT   Family History  Problem Relation Age of Onset  . Heart attack Mother   . Cervical cancer Mother   . Cancer Mother     cervical Cancer  . Heart attack Father   . Heart disease Father     AAA-rupture, MI  . Heart attack Brother   . Heart disease Brother     AAA rupture  . Breast cancer Maternal Aunt   . Cancer Maternal Aunt  breast   History   Social History  . Marital Status: Widowed    Spouse Name: N/A    Number of Children: 3  . Years of Education: 12   Occupational History  . admin ass't    Social History Main Topics  . Smoking status: Current Everyday Smoker -- 1.0 packs/day for 25 years  . Smokeless tobacco: Never Used  . Alcohol Use: No  . Drug Use: No  . Sexually Active: Not Currently   Other Topics Concern  . Not on file   Social History Narrative   HSG. Married - 2 sons, 1 daughter, 5 grandchildren. Patient continues to work-administrative assistant       Review of  Systems Constitutional:  Negative for fever, chills, activity change and unexpected weight change.  HEENT:  Negative for hearing loss, ear pain, congestion, neck stiffness and postnasal drip. Negative for sore throat or swallowing problems. Negative for dental complaints.   Eyes: Negative for vision loss or change in visual acuity. Occular migraines with visual change - lights w/ starburst appearance. Less than once /month Respiratory: Negative for chest tightness and wheezing. Negative for DOE.   Cardiovascular: Negative for chest pain.Positive for palpitations. No decreased exercise tolerance Gastrointestinal: No change in bowel habit. No bloating or gas. No reflux or indigestion Genitourinary: Negative for urgency, frequency, flank pain and difficulty urinating.  Musculoskeletal: Negative for myalgias, back pain, arthralgias and gait problem.  Neurological: Negative for dizziness, tremors, weakness and headaches.  Hematological: Negative for adenopathy.  Psychiatric/Behavioral: Negative for behavioral problems and dysphoric mood. Stressed at times and anxious       Objective:   Physical Exam (per Angelica Huynh, MSIV) Vitals reviewed-stable and normal Gen'l: well nourished, well developed white woman in no distress HEENT - L[place]/AT, EACs/TMs normal, oropharynx with native dentition in good condition, no buccal or palatal lesions, posterior pharynx clear, mucous membranes moist. C&S clear, PERRLA, fundi - normal Neck - supple, no thyromegaly Nodes- negative submental, cervical, supraclavicular regions Chest - no deformity, no CVAT Lungs - cleat without rales, feint expiratory wheezes that are inconsistent. No increased work of breathing Breast - skin normal, nipples w/o discharge, no fixed mass or lesion. Axilla clear Cardiovascular - regular rate and rhythm, quiet precordium, no murmurs, rubs or gallops, 2+ radial, DP and PT pulses Abdomen - BS+ x 4, no HSM, no guarding or rebound or  tenderness Pelvic - deferred to gyn Rectal - deferred to gyn Extremities - no clubbing, cyanosis, edema or deformity.  Neuro - A&O x 3, CN II-XII normal, motor strength normal and equal, DTRs 2+ and symmetrical biceps, radial, and patellar tendons. Cerebellar - no tremor, no rigidity, fluid movement and normal gait. Derm - Head, neck, back, abdomen and extremities without suspicious lesions  Lab Results  Component Value Date   WBC 6.4 10/21/2010   HGB 15.0 10/21/2010   HCT 45.7 10/21/2010   PLT 242.0 10/21/2010   GLUCOSE 81 10/21/2010   CHOL 178 10/21/2010   TRIG 143.0 10/21/2010   HDL 50.60 10/21/2010   LDLDIRECT 132.4 12/10/2007   LDLCALC 99 10/21/2010   ALT 16 10/21/2010   ALT 16 10/21/2010   AST 19 10/21/2010   AST 19 10/21/2010   NA 137 10/21/2010   K 4.2 10/21/2010   CL 102 10/21/2010   CREATININE 1.5* 10/21/2010   BUN 28* 10/21/2010   CO2 23 10/21/2010   TSH 5.89* 10/21/2010           Assessment & Plan:

## 2010-10-22 NOTE — Assessment & Plan Note (Signed)
H/o asthma now with DOE and with on-going tobacco abuse. Last CXR '09 with hyperinflation.  Plan - PFTs           Smoking cessation

## 2010-10-22 NOTE — Assessment & Plan Note (Addendum)
Interval medical hisotry with DOE but otherwise normal. Physical exam normal except for wheezing on expiration. Lab results are in normal limits. She is current with colorectal cancer and breast cancer screening. Immunizations - due for pneumonia vaccine, due for Tdap. 12 lead EKG non-specific T wave abnormalities.  In summary - a very pleasant woman who appears to be medically stable. She is counseled to start the process of smoking cessation - keep a smoker's diary. She is also encouraged to develop regular exercise program. She is asked to return in several weeks for continued smoking cessation counseling. For general medical care she will return in one year.

## 2010-10-22 NOTE — Assessment & Plan Note (Signed)
BP Readings from Last 3 Encounters:  10/21/10 136/82  07/16/09 138/66  09/27/08 128/70   Good control. Continue present medications.

## 2010-10-22 NOTE — Assessment & Plan Note (Signed)
Last LDL better than goal of 100 or less. Tolerating medications w/o adverse affects.  Plan - continue present medications

## 2010-10-22 NOTE — Assessment & Plan Note (Signed)
Patient with possible early glaucoma based on visit to opthalmologist. Migraines are infrequent. No further evaluation at this time.

## 2010-10-22 NOTE — Assessment & Plan Note (Signed)
Patient with AAA 5.0 cm at last study. No symptoms  Plan - refer to vascular surgery for evaluation and follow-up           Risk factor reduction

## 2010-10-23 NOTE — Progress Notes (Signed)
Addended by: Vernie Murders on: 10/23/2010 09:56 AM   Modules accepted: Orders

## 2010-10-24 ENCOUNTER — Other Ambulatory Visit: Payer: Self-pay

## 2010-10-24 DIAGNOSIS — I714 Abdominal aortic aneurysm, without rupture: Secondary | ICD-10-CM

## 2010-10-27 ENCOUNTER — Encounter: Payer: Self-pay | Admitting: Internal Medicine

## 2010-11-08 ENCOUNTER — Encounter: Payer: Self-pay | Admitting: Vascular Surgery

## 2010-11-14 ENCOUNTER — Encounter: Payer: Self-pay | Admitting: Vascular Surgery

## 2010-11-15 ENCOUNTER — Ambulatory Visit (INDEPENDENT_AMBULATORY_CARE_PROVIDER_SITE_OTHER): Payer: Medicare Other | Admitting: Vascular Surgery

## 2010-11-15 ENCOUNTER — Encounter: Payer: Self-pay | Admitting: Vascular Surgery

## 2010-11-15 VITALS — BP 163/77 | HR 63 | Resp 16 | Ht 62.0 in | Wt 120.0 lb

## 2010-11-15 DIAGNOSIS — I714 Abdominal aortic aneurysm, without rupture, unspecified: Secondary | ICD-10-CM

## 2010-11-15 DIAGNOSIS — Z01818 Encounter for other preprocedural examination: Secondary | ICD-10-CM

## 2010-11-15 NOTE — Progress Notes (Signed)
[facility] [place]  Referred by: Duke Salvia, MD [address] [facility] [address] [place], [place] [address]  Reason for referral: AAA  History of Present Illness  The patient is a 70 y.o. female who presents with chief complaint: inadvertent found AAA.  Previous studies demonstrate an AAA, measuring 5.0 cm x 4.7 cm (01/06/08).  The patient does not have back or abdominal pain.  The patient does not history of embolic episodes from the AAA.  The patient's risk factors for AAA included: family history of multiple family member, age, and smoking.  The patient smoke 1/2 pack/day of cigarettes.  Past Medical History  Diagnosis Date  . Pneumonia     Childhood  . Asthma     In remission  . Asthmatic bronchitis   . HTN (hypertension)   . Osteoporosis   . Hyperlipidemia   . Thyroid disease   . AAA (abdominal aortic aneurysm)   . Ocular migraine     Past Surgical History  Procedure Date  . Appendectomy   . Tubal ligation   . Knee arthroscopy   . Orif wrist fracture     LEFT  . Breast surgery     bilateral lumpectomies    History   Social History  . Marital Status: Widowed    Spouse Name: N/A    Number of Children: 3  . Years of Education: 12   Occupational History  . admin ass't    Social History Main Topics  . Smoking status: Current Everyday Smoker -- 1.0 packs/day for 25 years    Types: Cigarettes  . Smokeless tobacco: Never Used  . Alcohol Use: No  . Drug Use: No  . Sexually Active: Not Currently   Other Topics Concern  . Not on file   Social History Narrative   HSG. Married - 2 sons, 1 daughter, 5 grandchildren. Patient continues to work-administrative assistant    Family History  Problem Relation Age of Onset  . Heart attack Mother   . Cervical cancer Mother   . Cancer Mother     cervical Cancer  . Heart attack Father   . Heart disease Father     AAA-rupture, MI  . Heart attack Brother   . Heart disease  Brother     AAA rupture  . Breast cancer Maternal Aunt   . Cancer Maternal Aunt     breast    Current Outpatient Prescriptions on File Prior to Visit  Medication Sig Dispense Refill  . amLODipine (NORVASC) 10 MG tablet Take 10 mg by mouth daily.        . cetirizine (ZYRTEC) 10 MG chewable tablet Chew 10 mg by mouth daily.        . cetirizine (ZYRTEC) 10 MG tablet Take 10 mg by mouth daily.        . furosemide (LASIX) 20 MG tablet TAKE ONE TABLET BY MOUTH ONE TIME DAILY  30 tablet  1  . ibandronate (BONIVA) 150 MG tablet Take 150 mg by mouth every 30 (thirty) days. Take in the morning with a full glass of water, on an empty stomach, and do not take anything else by mouth or lie down for the next 30 min.       Marland Kitchen KLOR-CON M20 20 MEQ tablet TAKE ONE TABLET BY MOUTH ONE TIME DAILY  30 tablet  3  . levothyroxine (SYNTHROID, LEVOTHROID) 50 MCG tablet TAKE ONE TABLET BY MOUTH ONE TIME DAILY  30  tablet  4  . simvastatin (ZOCOR) 20 MG tablet Take 20 mg by mouth at bedtime.         Current Facility-Administered Medications on File Prior to Visit  Medication Dose Route Frequency Provider Last Rate Last Dose  . zoster vaccine live (PF) (ZOSTAVAX) injection 19,400 Units  0.65 mL Subcutaneous Once Duke Salvia, MD        Allergies  Allergen Reactions  . Sulfonamide Derivatives Hives    Severe itching    Review of Systems (Positive items checked otherwise negative)  General: [ ]  Weight loss, [ ]  Weight gain, [ ]   Loss of appetite, [ ]  Fever  Neurologic: [ ]  Dizziness, [ ]  Blackouts, [ ]  Headaches, [ ]  Seizure  Ear/Nose/Throat: [ ]  Change in eyesight, [ ]  Change in hearing, [ ]  Nose bleeds, [ ]  Sore throat  Vascular: [ ]  Pain in legs with walking, [x]  Pain in feet while lying flat, [ ]  Non-healing ulcer, Stroke, [ ]  "Mini stroke", [ ]  Slurred speech, [ ]  Temporary blindness, [ ]  Blood clot in vein, [ ]  Phlebitis  Pulmonary: [ ]  Home oxygen, [x]  Productive cough, [ ]  Bronchitis, [ ]   Coughing up blood,  [ ]  Asthma, [ ]  Wheezing  Musculoskeletal: [ ]  Arthritis, [ ]  Joint pain, [ ]  Muscle pain  Cardiac: [ ]  Chest pain, [ ]  Chest tightness/pressure, [ ]  Shortness of breath when lying flat, [ ]  Shortness of breath with exertion, [ ]  Palpitations, [ ]  Heart murmur, [ ]  Arrythmia,  [ ]  Atrial fibrillation  Hematologic: [ ]  Bleeding problems, [ ]  Clotting disorder, [ ]  Anemia  Psychiatric:  [ ]  Depression, [ ]  Anxiety, [ ]  Attention deficit disorder  Gastrointestinal:  [ ]  Black stool,[ ]   Blood in stool, [ ]  Peptic ulcer disease, [ ]  Reflux, [ ]  Hiatal hernia, [ ]  Trouble swallowing, [ ]  Diarrhea, [ ]  Constipation  Urinary:  [ ]  Kidney disease, [ ]  Burning with urination, [ ]  Frequent urination, [ ]  Difficulty urinating  Skin: [ ]  Ulcers, [ ]  Rashes  Physical Examination  Filed Vitals:   11/15/10 1025  BP: 163/77  Pulse: 63  Resp: 16  Height: 5\' 2"  (1.575 m)  Weight: 120 lb (54.432 kg)  SpO2: 98%   Body mass index is 21.95 kg/(m^2).  General: A&O x 3, WDWN  Head: [place]/AT  Ear/Nose/Throat: Hearing grossly intact, nares w/o erythema or drainage, oropharynx w/o Erythema/Exudate  Eyes: PERRLA, EOMI  Neck: Supple, no nuchal rigidity, no palpable LAD  Pulmonary: Sym exp, good air movt, CTAB, no rales, rhonchi, & wheezing  Cardiac: RRR, Nl S1, S2, no Murmurs, rubs or gallops  Vascular: Vessel Right Left  Radial Palpable Palpable  Brachial Palpable Palpable  Carotid Palpable, without bruit Palpable, without bruit  Aorta Palpable N/A  Femoral Palpable Palpable  Popliteal Non-palpable Non-palpable  PT Palpable Palpable  DP Palpable Palpable   Gastrointestinal: soft, NTND, -G/R, - HSM, - masses, - CVAT B, midline palpable pulsatile mass  Musculoskeletal: M/S 5/5 throughout , Extremities without ischemic changes   Neurologic: CN 2-12 intact , Pain and light touch intact in extremities , Motor exam as listed above  Psychiatric: Judgment intact, Mood &  affect appropriate for pt's clinical situation  Dermatologic: See M/S exam for extremity exam, no rashes otherwise noted  Lymph : No Cervical, Axillary, or Inguinal lymphadenopathy   Non-Invasive Vascular Imaging  Aortoiliac Duplex (Date: 11/15/10)  Current size:  5.53 cm x 5.50 cm (Date: 11/15/10)  Possible juxtarenal morphology  R CIA: 1.15 cm x 0.95 cm  L CIA: 1.01 cm x 1.30 cm  Outside Studies/Documentation 5 pages of outside documents were reviewed including: outpatient record.  Medical Decision Making  The patient is a 70 y.o. female who presents with: 5.5 cm AAA.   Based on this patient's exam and diagnostic studies, she needs CTA Abd/Pelvis.  The patient's renal function is marginal: Cr 1.5 so she will need aggressive rehydration prior to the study.  I have given her instruction in how to accomplish such.  The threshold for repair is AAA size > 5.5 cm, growth > 1 cm/yr, and symptomatic status.  So this patient meets criteria for repair.  The patient will follow up in 1 week with the CTA so I can determine what repair options she has  We will also arrange to have her seen by a cardiologist for risk stratification and preoperative optimization.  I emphasized the importance of maximal medical management including strict control of blood pressure, blood glucose, and lipid levels, obtaining regular exercise, and cessation of smoking.    Thank you for allowing [place] to participate in this patient's care.  Leonides Sake, MD [facility] Office: [phone] Pager: [phone]  11/15/2010, 6:29 PM

## 2010-11-15 NOTE — Progress Notes (Signed)
AAA duplex 11/15/2010 at [facility]

## 2010-11-18 ENCOUNTER — Other Ambulatory Visit: Payer: Self-pay | Admitting: Vascular Surgery

## 2010-11-18 ENCOUNTER — Ambulatory Visit
Admission: RE | Admit: 2010-11-18 | Discharge: 2010-11-18 | Disposition: A | Discharge status: Home / Self Care | Payer: Medicare Other | Source: Ambulatory Visit | Attending: Vascular Surgery | Admitting: Vascular Surgery

## 2010-11-18 DIAGNOSIS — I714 Abdominal aortic aneurysm, without rupture: Secondary | ICD-10-CM

## 2010-11-18 MED ORDER — IOHEXOL 300 MG/ML  SOLN
50.0000 mL | Freq: Once | INTRAMUSCULAR | Status: AC | PRN
  Administered 2010-11-18: 50 mL via INTRAVENOUS

## 2010-11-20 ENCOUNTER — Encounter: Payer: Self-pay | Admitting: Cardiology

## 2010-11-20 ENCOUNTER — Ambulatory Visit (INDEPENDENT_AMBULATORY_CARE_PROVIDER_SITE_OTHER): Payer: Medicare Other | Admitting: Cardiology

## 2010-11-20 DIAGNOSIS — I700 Atherosclerosis of aorta: Secondary | ICD-10-CM

## 2010-11-20 DIAGNOSIS — I10 Essential (primary) hypertension: Secondary | ICD-10-CM

## 2010-11-20 DIAGNOSIS — E7800 Pure hypercholesterolemia, unspecified: Secondary | ICD-10-CM

## 2010-11-20 DIAGNOSIS — I714 Abdominal aortic aneurysm, without rupture: Secondary | ICD-10-CM

## 2010-11-20 DIAGNOSIS — F17200 Nicotine dependence, unspecified, uncomplicated: Secondary | ICD-10-CM

## 2010-11-20 DIAGNOSIS — Z01818 Encounter for other preprocedural examination: Secondary | ICD-10-CM

## 2010-11-20 MED ORDER — HYDROCHLOROTHIAZIDE 25 MG PO TABS: 25.0000 mg | ORAL_TABLET | Freq: Every day | ORAL | Status: DC

## 2010-11-20 MED ORDER — ATORVASTATIN CALCIUM 20 MG PO TABS: 20.0000 mg | ORAL_TABLET | Freq: Every day | ORAL | Status: DC

## 2010-11-20 NOTE — Patient Instructions (Signed)
Stop lasix(furosemide).  Start hydrochlorothiazide 25mg  daily.  Start aspirin 81mg  daily--this should be enteric  coated.  Stop zocor(simvastatin).  Start lipitor (atorvastatin) 20mg  daily.  Your physician recommends that you return for lab work in about 10 days--BMET 272.0 401.9  Your physician recommends that you return for a FASTING lipid profile/liver profile in about 2 months --272.0  401.9     Your physician wants you to follow-up in: 1 year with Dr Shirlee Latch. (November 2013). You will receive a reminder letter in the mail two months in advance. If you don't receive a letter, please call our office to schedule the follow-up appointment.

## 2010-11-21 ENCOUNTER — Encounter: Payer: Self-pay | Admitting: Vascular Surgery

## 2010-11-21 ENCOUNTER — Encounter: Payer: Self-pay | Admitting: Cardiology

## 2010-11-21 DIAGNOSIS — Z01818 Encounter for other preprocedural examination: Secondary | ICD-10-CM | POA: Insufficient documentation

## 2010-11-21 DIAGNOSIS — I700 Atherosclerosis of aorta: Secondary | ICD-10-CM | POA: Insufficient documentation

## 2010-11-21 NOTE — Procedures (Unsigned)
DUPLEX ULTRASOUND OF ABDOMINAL AORTA  INDICATION:  Abdominal aortic aneurysm  HISTORY: Diabetes:  No Cardiac:  No Hypertension:  Yes Smoking:  Currently Connective Tissue Disorder: Family History:  For AAA father and brother Previous Surgery:  No aortic intervention  DUPLEX EXAM:         AP (cm)                   TRANSVERSE (cm) Proximal             2.3 cm                    2.3 cm Mid                  5.53 cm                   5.50 cm Distal               1.64 cm                   1.82 cm Right Iliac          0.95 cm                   1.15 cm Left Iliac           1.01 cm                   1.30 cm  PREVIOUS:  Date:  AP:  TRANSVERSE:  IMPRESSION: 1. Abdominal aortic aneurysm that dilates at the renal artery and     continues to the distal segment with a maximum diameter of 5.53 cm     x 5.50 cm with intramural thrombus present. 2. Normal diameters noted involving the bilateral iliac arteries. 3. Elevated velocity involving the left common iliac artery suggestive     of 50%-75% stenosis with a peak systolic velocity of 269 cm/second.  ___________________________________________ Fransisco Hertz, MD  SH/MEDQ  D:  11/15/2010  T:  11/15/2010  Job:  [id number]

## 2010-11-21 NOTE — Progress Notes (Signed)
PCP: Dr. Debby Bud  70 yo with history of COPD and AAA presents for evaluation prior to AAA surgery.  Patient has a 5.6 cm AAA that needs repair.  She does not have any back or abdominal pain.  She smokes about 3-5 cigarettes/day.  She used to smoke heavier in the past.  She does have a strong family history of both AAA and CAD.  BP is high in the office today but has been normal at past office appointments.  Patient denies chest pain or tightness.  She has never had any cardiac problems.  She can climb steps with minimal dyspnea at the top.  No history of presyncope or syncope.  She walks for 30 minutes/day with no problems.   ECG: NSR, shallow T wave inversions laterally.   Labs (10/12): creatinine 1.5, LDL 99, HDL 51  PMH: 1. AAA: 5.5 cm by [place] 11/12, 5.6 cm by CT 11/12.  2. Significant aortic atherosclerosis on CT angiogram of abdomen/pelvis 11/12.  3. Asthma/COPD: Active smoker.  4. HTN 5. Osteoporosis 6. Appendectomy.  7. Migraines 8. Hypothyroidism 9. Hyperlipidemia 10. CKD  SH: Widow, lives in [place]). 3 kids.  Smokes about 3-5 cigarettes/day (more heavy in past).   FH: Father, half brother, and brother all had AAAs.  Mother with MI at 54, father with MI in his late 76s, brother with MI.   ROS: All systems reviewed and negative except as per HPI.   Current Outpatient Prescriptions  Medication Sig Dispense Refill  . amLODipine (NORVASC) 10 MG tablet Take 10 mg by mouth daily.        . cetirizine (ZYRTEC) 10 MG tablet Take 10 mg by mouth daily.        Marland Kitchen ibandronate (BONIVA) 150 MG tablet Take 150 mg by mouth every 30 (thirty) days. Take in the morning with a full glass of water, on an empty stomach, and do not take anything else by mouth or lie down for the next 30 min.       Marland Kitchen KLOR-CON M20 20 MEQ tablet TAKE ONE TABLET BY MOUTH ONE TIME DAILY  30 tablet  3  . levothyroxine (SYNTHROID, LEVOTHROID) 50 MCG tablet TAKE ONE TABLET BY MOUTH ONE TIME DAILY  30 tablet   4  . ZOSTAVAX 16109 UNT/0.65ML injection       . aspirin EC 81 MG tablet Take 1 tablet (81 mg total) by mouth daily.      Marland Kitchen atorvastatin (LIPITOR) 20 MG tablet Take 1 tablet (20 mg total) by mouth daily.  30 tablet  3  . hydrochlorothiazide (HYDRODIURIL) 25 MG tablet Take 1 tablet (25 mg total) by mouth daily.  30 tablet  11   Current Facility-Administered Medications  Medication Dose Route Frequency Provider Last Rate Last Dose  . zoster vaccine live (PF) (ZOSTAVAX) injection 19,400 Units  0.65 mL Subcutaneous Once Carney Harder Norins, MD        BP 148/78  Pulse 65  Ht 5\' 2"  (1.575 m)  Wt 53.978 kg (119 lb)  BMI 21.77 kg/m2 General: NAD Neck: No JVD, no thyromegaly or thyroid nodule.  Lungs: Clear to auscultation bilaterally with normal respiratory effort. CV: Nondisplaced PMI.  Heart regular S1/S2, no S3/S4, no murmur.  No peripheral edema.  No carotid bruit.  Normal pedal pulses.  Abdomen: Soft, nontender, no hepatosplenomegaly, no distention.  Skin: Intact without lesions or rashes.  Neurologic: Alert and oriented x 3.  Psych: Normal affect. Extremities: No clubbing or cyanosis.  HEENT: Normal.

## 2010-11-21 NOTE — Assessment & Plan Note (Signed)
Patient denies chest pain or exertional dyspnea.  She can exert herself to well over 4 METS.  She certainly has a number of risk factors for CAD but has no history of heart problems and no symptoms.  I do not think that further testing prior to surgery would be beneficial.  Needs to aggressively manage risk factors.

## 2010-11-21 NOTE — Assessment & Plan Note (Signed)
CT abdomen showed prominent aortic atherosclerosis.  I am going to have her start ASA 81 mg daily for MI prevention.  Given known vascular disease (AAA and aortic atherosclerosis), I would aim for LDL < 70.  Therefore, I will change her statin to atorvastatin 20 mg daily (cannot increase simvastatin with use of amlodipine due to potential for interaction).  Lipids/LFTs in 2 months.

## 2010-11-21 NOTE — Assessment & Plan Note (Addendum)
Patient has been on Lasix for years apparently for BP control.  However, BUN and creatinine were both elevated when checked recently.  I would like to take her off Lasix and have her take HCTZ instead.  This should give better blood pressure control with less risk of dehydration.  BMET in 10 days.

## 2010-11-21 NOTE — Assessment & Plan Note (Signed)
I strongly encouraged her to quit smoking.  

## 2010-11-22 ENCOUNTER — Encounter: Payer: Self-pay | Admitting: Vascular Surgery

## 2010-11-22 ENCOUNTER — Ambulatory Visit (INDEPENDENT_AMBULATORY_CARE_PROVIDER_SITE_OTHER): Payer: Medicare Other | Admitting: Vascular Surgery

## 2010-11-22 VITALS — BP 132/67 | HR 66 | Ht 62.0 in | Wt 120.0 lb

## 2010-11-22 DIAGNOSIS — I714 Abdominal aortic aneurysm, without rupture: Secondary | ICD-10-CM

## 2010-11-22 NOTE — Progress Notes (Signed)
[facility] [place]  Established Abdominal Aortic Aneurysm  History of Present Illness  The patient is a 70 y.o. female who presents with chief complaint: follow up on CTA abd/pelvis for AAA.  The patient does not have back or abdominal pain.  The patient does not have back or abdominal pain. The patient does not history of embolic episodes from the AAA. The patient's risk factors for AAA included: family history of multiple family member, age, and smoking. The patient smoke 1/2 pack/day of cigarettes.  The patient's PMH, PSH, SH, FamHx, Med, Allergies and ROS are unchanged from 11/15/10.  Physical Examination  Filed Vitals:   11/22/10 1138  BP: 132/67  Pulse: 66  Height: 5\' 2"  (1.575 m)  Weight: 120 lb (54.432 kg)  SpO2: 98%   Body mass index is 21.95 kg/(m^2).  General: A&O x 3, WDWN  Pulmonary: Sym exp, good air movt, CTAB, no rales, rhonchi, & wheezing  Cardiac: RRR, Nl S1, S2, no Murmurs, rubs or gallops  Vascular: Vessel Right Left  Radial Palpable Palpable  Brachial Palpable Palpable  Carotid Palpable, without bruit Palpable, without bruit  Aorta Palpable N/A  Femoral Palpable Palpable  Popliteal Non-palpable Non-palpable  PT Palpable Palpable  DP Palpable Palpable   Gastrointestinal: soft, NTND, -G/R, - HSM, - masses, - CVAT B, palpable AAA  Musculoskeletal: M/S 5/5 throughout , Extremities without ischemic changes   Neurologic: Pain and light touch intact in extremities , Motor exam as listed above  CTA Abd/Pelvis (11/18/10) Proximal aorta: Scattered atheromatous plaque in the visualized  distal descending and suprarenal portions of the abdominal aorta.   Celiac axis: Partially calcified nonocclusive ostial plaque, patent distally   Superior mesenteric artery: Partially calcified nonocclusive ostial plaque, patent distally.   Left renal artery: Single, with partially calcified ostial plaque over a length of approximately 12 mm without  significant stenosis   Right renal artery: Small, with minimal partially calcified plaque just beyond its ostium, no significant stenosis.   There is a juxtarenal fusiform abdominal aortic aneurysm originating just below the level of the left renal artery with a significant amount of partially calcified luminal thrombus.   Length of infrarenal neck (from lowest renal artery): 0mm   Number of renal arteries: Right = 1; Left = 1   Diameter of infrarenal neck: 17 mm   Total length of aneurysm: 93 mm   Aneurysm ends at aortic bifurcation: Y If no, Distance from aneurysm to bifurcation:   Greatest aneurysm diameter: 5.6 cm  Greatest common iliac artery diameters: Right =15.3 mm; Left = 14.5 mm   Diameter of common iliac arteries just above iliac bifurcation: Right = 8 mm ; Left = 9mm   Length of common iliac arteries: Right = 5.6 cm; Left = 5.8 cm   Visualized lung bases clear. Unremarkable liver, spleen, pancreas, gallbladder. There is bilateral adrenal hyperplasia. There is moderate right renal parenchymal atrophy. Left kidney  unremarkable. Stomach and small bowel are nondilated. The appendix is not localized. The colon is nondistended. Urinary bladder physiologically distended. Uterus and adnexal regions  unremarkable. No ascites. No free air. No adenopathy localized. No hydronephrosis. Lumbar spine intact.   IMPRESSION:  1. 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  2. Right renal parenchymal atrophy.  Based on my review of the CTA, this patient's juxtarenal AAA is heavily calcified.  She has essentially one functional kidney with some mild elevation creatinine already.  Clamping in the pararenal segment may be difficult due  to the calcification.  I also have concerns with possible difficulty with sewing into the infrarenal segment of the aorta given the substantial calcification.  Medical Decision Making  The patient is a 70 y.o. female who presents with: 5.6 cm asx  juxtarenal AAA.   Based on this patient's exam and diagnostic studies, she needs repair of her juxtarenal AAA.    However, I have concerns with proceeding with an open repair.  The pararenal segment is heavily calcified, so a supraceliac clamp may be necessary for proximal control.  Either suprarenal or supraceliac clamping will result in left renal ischemia, which may result in renal failure due to the extended time that may be needed to sew a graft to the infrarenal aorta as it's heavily calcified.  I also have concerns that the infrarenal aorta may not be possible to sew to.  Based on these concerns, I have discussed with the patient considering possibly a fenestrated endograft in this case.  As I do not perform this procedure, I have recommended referral to Dr. Pattricia Boss at [facility] for consideration of such.  I emphasized the importance of maximal medical management including strict control of blood pressure, blood glucose, and lipid levels, obtaining regular exercise, and cessation of smoking.    We discussed going to the ER if at any point she develop severe abdominal or back pain.    Her cardiac clearance has already been completed by Dr. Shirlee Latch.  We will obtain copies of all her studies and forward them to Dr. Pattricia Boss for review.  Thank you for allowing [place] to participate in this patient's care.  Leonides Sake, MD [facility] [place] Office: [phone] Pager: [phone]  11/22/2010, 6:22 PM

## 2010-12-02 ENCOUNTER — Other Ambulatory Visit: Payer: Medicare Other

## 2010-12-18 ENCOUNTER — Other Ambulatory Visit: Payer: Self-pay | Admitting: Internal Medicine

## 2011-01-07 DIAGNOSIS — I219 Acute myocardial infarction, unspecified: Secondary | ICD-10-CM

## 2011-01-07 HISTORY — PX: VASCULAR SURGERY: SHX849

## 2011-01-07 HISTORY — DX: Acute myocardial infarction, unspecified: I21.9

## 2011-01-13 ENCOUNTER — Other Ambulatory Visit: Payer: Medicare Other

## 2011-01-27 ENCOUNTER — Other Ambulatory Visit: Payer: Self-pay

## 2011-01-27 ENCOUNTER — Inpatient Hospital Stay (HOSPITAL_COMMUNITY)
Admission: EM | Admit: 2011-01-27 | Discharge: 2011-01-29 | DRG: 247 | Disposition: A | Discharge status: Home Health | Payer: Medicare Other | Source: Other Acute Inpatient Hospital | Attending: Cardiology | Admitting: Cardiology

## 2011-01-27 ENCOUNTER — Encounter (HOSPITAL_COMMUNITY): Payer: Self-pay

## 2011-01-27 ENCOUNTER — Encounter (HOSPITAL_COMMUNITY)
Admission: EM | Disposition: A | Discharge status: Home Health | Payer: Self-pay | Source: Other Acute Inpatient Hospital | Attending: Cardiology

## 2011-01-27 DIAGNOSIS — E785 Hyperlipidemia, unspecified: Secondary | ICD-10-CM | POA: Diagnosis present

## 2011-01-27 DIAGNOSIS — I059 Rheumatic mitral valve disease, unspecified: Secondary | ICD-10-CM

## 2011-01-27 DIAGNOSIS — M810 Age-related osteoporosis without current pathological fracture: Secondary | ICD-10-CM | POA: Diagnosis present

## 2011-01-27 DIAGNOSIS — R079 Chest pain, unspecified: Secondary | ICD-10-CM

## 2011-01-27 DIAGNOSIS — I714 Abdominal aortic aneurysm, without rupture, unspecified: Secondary | ICD-10-CM | POA: Diagnosis present

## 2011-01-27 DIAGNOSIS — I129 Hypertensive chronic kidney disease with stage 1 through stage 4 chronic kidney disease, or unspecified chronic kidney disease: Secondary | ICD-10-CM | POA: Diagnosis present

## 2011-01-27 DIAGNOSIS — I2119 ST elevation (STEMI) myocardial infarction involving other coronary artery of inferior wall: Principal | ICD-10-CM | POA: Diagnosis present

## 2011-01-27 DIAGNOSIS — F17200 Nicotine dependence, unspecified, uncomplicated: Secondary | ICD-10-CM | POA: Diagnosis present

## 2011-01-27 DIAGNOSIS — N183 Chronic kidney disease, stage 3 unspecified: Secondary | ICD-10-CM | POA: Diagnosis present

## 2011-01-27 DIAGNOSIS — Z7982 Long term (current) use of aspirin: Secondary | ICD-10-CM

## 2011-01-27 DIAGNOSIS — E039 Hypothyroidism, unspecified: Secondary | ICD-10-CM | POA: Diagnosis present

## 2011-01-27 DIAGNOSIS — J45909 Unspecified asthma, uncomplicated: Secondary | ICD-10-CM | POA: Insufficient documentation

## 2011-01-27 DIAGNOSIS — Z79899 Other long term (current) drug therapy: Secondary | ICD-10-CM

## 2011-01-27 DIAGNOSIS — I2510 Atherosclerotic heart disease of native coronary artery without angina pectoris: Secondary | ICD-10-CM | POA: Diagnosis present

## 2011-01-27 DIAGNOSIS — I10 Essential (primary) hypertension: Secondary | ICD-10-CM | POA: Insufficient documentation

## 2011-01-27 DIAGNOSIS — I213 ST elevation (STEMI) myocardial infarction of unspecified site: Secondary | ICD-10-CM

## 2011-01-27 HISTORY — PX: LEFT HEART CATHETERIZATION WITH CORONARY ANGIOGRAM: SHX5451

## 2011-01-27 HISTORY — DX: Tobacco use: Z72.0

## 2011-01-27 HISTORY — DX: Hypothyroidism, unspecified: E03.9

## 2011-01-27 HISTORY — PX: PERCUTANEOUS CORONARY STENT INTERVENTION (PCI-S): SHX5485

## 2011-01-27 HISTORY — PX: CORONARY ANGIOPLASTY: SHX604

## 2011-01-27 HISTORY — DX: Atherosclerotic heart disease of native coronary artery without angina pectoris: I25.10

## 2011-01-27 LAB — BASIC METABOLIC PANEL
BUN: 15 mg/dL (ref 6–23)
CO2: 20 mEq/L (ref 19–32)
GFR calc non Af Amer: 38 mL/min — ABNORMAL LOW (ref >90)
Glucose, Bld: 114 mg/dL — ABNORMAL HIGH (ref 70–99)
Potassium: 4.4 mEq/L (ref 3.5–5.1)

## 2011-01-27 LAB — POCT I-STAT, CHEM 8
BUN: 17 mg/dL (ref 6–23)
Chloride: 108 mEq/L (ref 96–112)
HCT: 33 % — ABNORMAL LOW (ref 36.0–46.0)
Potassium: 4 mEq/L (ref 3.5–5.1)
Sodium: 140 mEq/L (ref 135–145)

## 2011-01-27 LAB — CARDIAC PANEL(CRET KIN+CKTOT+MB+TROPI)
CK, MB: 14.7 ng/mL (ref 0.3–4.0)
Relative Index: 9.1 — ABNORMAL HIGH (ref 0.0–2.5)

## 2011-01-27 LAB — POCT ACTIVATED CLOTTING TIME: Activated Clotting Time: 353 seconds

## 2011-01-27 LAB — CBC
HCT: 31.3 % — ABNORMAL LOW (ref 36.0–46.0)
Hemoglobin: 10.4 g/dL — ABNORMAL LOW (ref 12.0–15.0)
MCH: 28.2 pg (ref 26.0–34.0)
MCHC: 33.2 g/dL (ref 30.0–36.0)

## 2011-01-27 SURGERY — PERCUTANEOUS CORONARY STENT INTERVENTION (PCI-S)
Laterality: Right

## 2011-01-27 MED ORDER — LIDOCAINE HCL (PF) 1 % IJ SOLN
INTRAMUSCULAR | Status: AC
  Filled 2011-01-27: qty 30

## 2011-01-27 MED ORDER — NITROGLYCERIN 0.2 MG/ML ON CALL CATH LAB
INTRAVENOUS | Status: AC
  Filled 2011-01-27: qty 1

## 2011-01-27 MED ORDER — MORPHINE SULFATE 2 MG/ML IJ SOLN
1.0000 mg | INTRAMUSCULAR | Status: DC | PRN
  Administered 2011-01-28: 1 mg via INTRAVENOUS
  Filled 2011-01-27: qty 1

## 2011-01-27 MED ORDER — BIVALIRUDIN 250 MG IV SOLR
INTRAVENOUS | Status: AC
  Filled 2011-01-27: qty 250

## 2011-01-27 MED ORDER — ROSUVASTATIN CALCIUM 40 MG PO TABS
40.0000 mg | ORAL_TABLET | Freq: Every day | ORAL | Status: DC
  Administered 2011-01-27 – 2011-01-29 (×3): 40 mg via ORAL
  Filled 2011-01-27 (×3): qty 1

## 2011-01-27 MED ORDER — AMLODIPINE BESYLATE 10 MG PO TABS
10.0000 mg | ORAL_TABLET | Freq: Every day | ORAL | Status: DC
  Administered 2011-01-27: 10 mg via ORAL
  Filled 2011-01-27 (×2): qty 1

## 2011-01-27 MED ORDER — NITROGLYCERIN IN D5W 200-5 MCG/ML-% IV SOLN
2.0000 ug/min | INTRAVENOUS | Status: DC
  Administered 2011-01-27: 10 ug/min via INTRAVENOUS

## 2011-01-27 MED ORDER — HEPARIN (PORCINE) IN NACL 2-0.9 UNIT/ML-% IJ SOLN
INTRAMUSCULAR | Status: AC
  Filled 2011-01-27: qty 2000

## 2011-01-27 MED ORDER — ASPIRIN EC 81 MG PO TBEC
81.0000 mg | DELAYED_RELEASE_TABLET | Freq: Every day | ORAL | Status: DC
  Administered 2011-01-27 – 2011-01-29 (×3): 81 mg via ORAL
  Filled 2011-01-27 (×3): qty 1

## 2011-01-27 MED ORDER — SODIUM CHLORIDE 0.9 % IV SOLN
INTRAVENOUS | Status: AC
  Administered 2011-01-27: 04:00:00 via INTRAVENOUS

## 2011-01-27 MED ORDER — CLOPIDOGREL BISULFATE 300 MG PO TABS
ORAL_TABLET | ORAL | Status: AC
  Filled 2011-01-27: qty 2

## 2011-01-27 MED ORDER — LORATADINE 10 MG PO TABS
10.0000 mg | ORAL_TABLET | Freq: Every day | ORAL | Status: DC
  Administered 2011-01-27 – 2011-01-29 (×3): 10 mg via ORAL
  Filled 2011-01-27 (×3): qty 1

## 2011-01-27 MED ORDER — SODIUM CHLORIDE 0.9 % IV SOLN
0.2500 mg/kg/h | INTRAVENOUS | Status: AC
  Administered 2011-01-27: 0.244 mg/kg/h via INTRAVENOUS
  Filled 2011-01-27: qty 250

## 2011-01-27 MED ORDER — SIMVASTATIN 40 MG PO TABS
40.0000 mg | ORAL_TABLET | Freq: Every day | ORAL | Status: DC
  Filled 2011-01-27: qty 1

## 2011-01-27 MED ORDER — CLOPIDOGREL BISULFATE 75 MG PO TABS
75.0000 mg | ORAL_TABLET | Freq: Every day | ORAL | Status: DC
  Administered 2011-01-28 – 2011-01-29 (×2): 75 mg via ORAL
  Filled 2011-01-27 (×3): qty 1

## 2011-01-27 MED ORDER — ASPIRIN EC 325 MG PO TBEC
325.0000 mg | DELAYED_RELEASE_TABLET | Freq: Every day | ORAL | Status: DC
  Filled 2011-01-27: qty 1

## 2011-01-27 MED ORDER — ONDANSETRON HCL 4 MG/2ML IJ SOLN: 4.0000 mg | Freq: Four times a day (QID) | INTRAMUSCULAR | Status: DC | PRN

## 2011-01-27 MED ORDER — METOPROLOL TARTRATE 12.5 MG HALF TABLET
12.5000 mg | ORAL_TABLET | Freq: Two times a day (BID) | ORAL | Status: DC
  Administered 2011-01-27 – 2011-01-29 (×3): 12.5 mg via ORAL
  Filled 2011-01-27 (×6): qty 1

## 2011-01-27 MED ORDER — LEVOTHYROXINE SODIUM 50 MCG PO TABS
50.0000 ug | ORAL_TABLET | Freq: Every day | ORAL | Status: DC
  Administered 2011-01-27 – 2011-01-29 (×3): 50 ug via ORAL
  Filled 2011-01-27 (×4): qty 1

## 2011-01-27 MED ORDER — ACETAMINOPHEN 325 MG PO TABS: 650.0000 mg | ORAL_TABLET | ORAL | Status: DC | PRN

## 2011-01-27 MED ORDER — MIDAZOLAM HCL 2 MG/2ML IJ SOLN
INTRAMUSCULAR | Status: AC
  Filled 2011-01-27: qty 2

## 2011-01-27 MED ORDER — FENTANYL CITRATE 0.05 MG/ML IJ SOLN
INTRAMUSCULAR | Status: AC
  Filled 2011-01-27: qty 2

## 2011-01-27 NOTE — Progress Notes (Signed)
Pt smoked 1 ppd but has gone to 1/2 ppd now. She is eager to quit and in action stage. Wants to quit gradually. Recommended 14 mg patch x 2 weeks and 7 mg patch x 2 weeks. Discussed patch use instructions. Referred to 1-800 quit now for f/u and support. Discussed oral fixation substitutes, second hand smoke and in home smoking policy. Reviewed and gave pt Written education/contact information.

## 2011-01-27 NOTE — Progress Notes (Signed)
2D Echo Completed 2:35 pm.  Emelia Loron, RDCS

## 2011-01-27 NOTE — Progress Notes (Addendum)
CARDIAC REHAB PHASE I   PRE:  Rate/Rhythm: 65 SR  BP:  Supine: 124/48  Sitting:   Standing:    SaO2: 97 RA  MODE:  Ambulation: 300 ft   POST:  Rate/Rhythem: 91 SR  BP:  Supine:   Sitting: 139/53  Standing:    SaO2: 90 RA 1450-1535 Assisted X 1 to ambulate. Gait steady. Pt c/o of legs feeling weak and a little dizzy. Sat in chair in hall, able to walk back to room. VS stable after walk. To recliner after walk with call light in reach. After being in chair states she felt a ache in her chest, dull pain, soreness. Discomfort felt better after passing gas, per pt. Reported event to RN.  Beatrix Fetters

## 2011-01-27 NOTE — Progress Notes (Signed)
01/27/11 0400  Clinical Encounter Type  Visited With Family  Visit Type Spiritual support;Code  Referral From Nurse  Stress Factors  Family Stress Factors None identified    Chaplain's Note: 01:40am, responded to ed page to assist family of CODE STEMI patient.  Introduced self to pt son and dtr-in-law and offered pastoral presence and company during cath lab procedure.  Assisted other son and grnd dtrs to consult rm.  Assisted family to 2900 to see pt after procedure.  Will follow-up as needed or requested.

## 2011-01-27 NOTE — H&P (Signed)
Angelica Huynh is an 71 y.o. female with AAA, tobacco use, HTN, hyperlipidemia and an extensive family history of AAA and CAD. Chief Complaint: Chest pain HPI: She was in her usual state of health until about 3 days ago when she started having on and off chest pain which she though was due to GERD and therefore did not pay more attention to it. She was woken up from sleep at about midnight today with the same pain at a heightened intensity so she called 911. She was taken to [facility] and an EKG there revealed ST elevation in the inferior leads. A STEMI code was called and she was transferred here for further management. Cardiac catheterization was significant for a 100% mid RCA occlusion which was amenable to PCI.  Her cardiac history is significant for AAA which has been followed since it was 3 cm until its present diameter of close over 5.6cm. She is being prepared for surgery in February of 2013. Also of note is the fact that she has recently developed some worsening renal failure and her lasix was discontinued in favor of a thiazide diuretic.  Past Medical History  Diagnosis Date  . Pneumonia     Childhood  . Asthma     In remission  . Asthmatic bronchitis   . HTN (hypertension)   . Osteoporosis   . Hyperlipidemia   . Thyroid disease   . AAA (abdominal aortic aneurysm)   . Ocular migraine   . Hypothyroidism     Past Surgical History  Procedure Date  . Appendectomy   . Tubal ligation   . Knee arthroscopy   . Orif wrist fracture     LEFT  . Breast surgery     bilateral lumpectomies    Family History  Problem Relation Age of Onset  . Heart attack Mother   . Cervical cancer Mother   . Cancer Mother     cervical Cancer  . Heart attack Father   . Heart disease Father     AAA-rupture, MI  . Heart attack Brother   . Heart disease Brother     AAA rupture  . Breast cancer Maternal Aunt   . Cancer Maternal Aunt     breast   Social History:  reports that she has  been smoking Cigarettes.  She has a 12.5 pack-year smoking history. She has never used smokeless tobacco. She reports that she does not drink alcohol or use illicit drugs.  Allergies:  Allergies  Allergen Reactions  . Sulfonamide Derivatives Hives    Severe itching    Medications Prior to Admission  Medication Dose Route Frequency Provider Last Rate Last Dose  . 0.9 %  sodium chloride infusion   Intravenous Continuous Corky Crafts, MD      . acetaminophen (TYLENOL) tablet 650 mg  650 mg Oral Q4H PRN Corky Crafts, MD      . amLODipine (NORVASC) tablet 10 mg  10 mg Oral Daily Corky Crafts, MD      . aspirin EC tablet 325 mg  325 mg Oral Daily Corky Crafts, MD      . bivalirudin (ANGIOMAX) 250 MG injection           . bivalirudin (ANGIOMAX) 5 mg/mL in sodium chloride 0.9 % 50 mL infusion  0.25 mg/kg/hr Intravenous Continuous Corky Crafts, MD      . clopidogrel (PLAVIX) 300 MG tablet           . clopidogrel (  PLAVIX) tablet 75 mg  75 mg Oral Q breakfast Corky Crafts, MD      . fentaNYL (SUBLIMAZE) 0.05 MG/ML injection           . heparin 2-0.9 UNIT/ML-% infusion           . levothyroxine (SYNTHROID, LEVOTHROID) tablet 50 mcg  50 mcg Oral QAC breakfast Corky Crafts, MD      . lidocaine (XYLOCAINE) 1 % injection           . loratadine (CLARITIN) tablet 10 mg  10 mg Oral Daily Corky Crafts, MD      . metoprolol tartrate (LOPRESSOR) tablet 12.5 mg  12.5 mg Oral BID Corky Crafts, MD      . midazolam (VERSED) 2 MG/2ML injection           . morphine 2 MG/ML injection 1 mg  1 mg Intravenous Q1H PRN Corky Crafts, MD      . nitroGLYCERIN (NTG ON-CALL) 0.2 mg/mL injection           . nitroGLYCERIN 0.2 mg/mL in dextrose 5 % infusion  2-200 mcg/min Intravenous Continuous Corky Crafts, MD      . ondansetron [facility]) injection 4 mg  4 mg Intravenous Q6H PRN Corky Crafts, MD      . simvastatin (ZOCOR) tablet 40  mg  40 mg Oral Daily Corky Crafts, MD       Medications Prior to Admission  Medication Sig Dispense Refill  . amLODipine (NORVASC) 10 MG tablet Take 10 mg by mouth daily.        Marland Kitchen aspirin EC 81 MG tablet Take 1 tablet (81 mg total) by mouth daily.      Marland Kitchen atorvastatin (LIPITOR) 20 MG tablet Take 1 tablet (20 mg total) by mouth daily.  30 tablet  3  . cetirizine (ZYRTEC) 10 MG tablet Take 10 mg by mouth daily.        . furosemide (LASIX) 20 MG tablet       . furosemide (LASIX) 20 MG tablet TAKE ONE TABLET BY MOUTH ONE TIME DAILY  30 tablet  1  . hydrochlorothiazide (HYDRODIURIL) 25 MG tablet Take 1 tablet (25 mg total) by mouth daily.  30 tablet  11  . ibandronate (BONIVA) 150 MG tablet Take 150 mg by mouth every 30 (thirty) days. Take in the morning with a full glass of water, on an empty stomach, and do not take anything else by mouth or lie down for the next 30 min.       Marland Kitchen KLOR-CON M20 20 MEQ tablet TAKE ONE TABLET BY MOUTH ONE TIME DAILY  30 tablet  3  . levothyroxine (SYNTHROID, LEVOTHROID) 50 MCG tablet TAKE ONE TABLET BY MOUTH ONE TIME DAILY  30 tablet  4  . simvastatin (ZOCOR) 20 MG tablet       . ZOSTAVAX 56213 UNT/0.65ML injection         No results found for this or any previous visit (from the past 48 hour(s)). No results found.  Review of Systems  Constitutional: Negative.   HENT: Negative.   Eyes: Negative.   Respiratory: Negative.   Gastrointestinal: Negative.   Genitourinary: Negative.   Musculoskeletal: Negative.   Skin: Negative.   Neurological: Negative.   Endo/Heme/Allergies: Negative.   Psychiatric/Behavioral: Negative.     Blood pressure 145/68, pulse 65, temperature 97.7 F (36.5 C), temperature source Oral, resp. rate 15, height 5\' 1"  (1.549 m), weight 122  lb 2.2 oz (55.4 kg), SpO2 100.00%. Physical Exam  Constitutional: She is oriented to person, place, and time. No distress.  HENT:  Mouth/Throat: No oropharyngeal exudate.  Eyes: No scleral  icterus.  Neck: No JVD present.  Cardiovascular: Normal rate, regular rhythm, normal heart sounds and intact distal pulses.  Exam reveals no gallop and no friction rub.   No murmur heard. Respiratory: Effort normal and breath sounds normal. No respiratory distress. She has no wheezes. She has no rales. She exhibits no tenderness.  GI: Soft. She exhibits no distension. There is no tenderness.  Musculoskeletal: Normal range of motion. She exhibits no edema and no tenderness.  Neurological: She is alert and oriented to person, place, and time. She has normal reflexes. No cranial nerve deficit.  Skin: Skin is warm and dry. No rash noted. She is not diaphoretic. No erythema. No pallor.  Psychiatric: She has a normal mood and affect. Her behavior is normal. Judgment and thought content normal.     Assessment/Plan STEMI  AAA 5.6 cm  Current smoker  PLAN Patient is now post PCI to RCA Obtain 2D echo in am ASA 325 mg daily Plavix 75 mg daily Statin Betablocker Will hold off on ACEI at this time given renal insufficiency Smoking cessation counseling. Hydration    Ayiden Milliman 01/27/2011, 3:53 AM

## 2011-01-27 NOTE — Progress Notes (Signed)
PROCEDURE:  Left heart catheterization with selective coronary angiography, PCI RCA  INDICATIONS:  Inferior STEMI  Emergency consent  PROCEDURE TECHNIQUE:  After Xylocaine anesthesia a 16F sheath was placed in the right femoral artery with a single anterior needle wall stick.   Left coronary angiography was done using a Judkins L4 guide catheter.  Right coronary angiography was done using a Judkins R4 guide catheter.  PCI RCA was done with angiomax for anticoagulation.  Intracoronary NTG was given to treat spasm of the distal vessel. Left heart cath was done using a pigtail catheter.  All catheter exchanges were performed using an exchange length wire to avoid any trauma to the abdominal aortic aneurysm.  Of note, initial access to the aorta was made difficult due to some calcification in the right iliac system.  A versacore wire was required to navigate the calcific disease.     CONTRAST:  Total of 80 cc.  COMPLICATIONS:  None.    HEMODYNAMICS:  Aortic pressure was 139/68; LV pressure was 140/11; LVEDP 18.  There was no gradient between the left ventricle and aorta.    ANGIOGRAPHIC DATA:   The left main coronary artery has an ostial 25% stenosis.  There was no pressure drop across the stenosis with the catheter.  The left anterior descending artery is a medium-size vessel.  In the proximal LAD, there is mild atherosclerosis.  In the mid LAD at the origin of the first diagonal, there is a 25% focal lesion.  The first diagonal is a medium-sized vessel which appears widely patent.  The septal perforators provide collateral flow to the distal right coronary system..  The left circumflex artery is a large vessel.  The proximal vessel is quite tortuous.  There is a large branching OM1 which has mild to moderate disease at the bifurcation.  The lower pole of the OM is a larger vessel.  There is no hemodynamically significant disease..  The right coronary artery is a medium size vessel.  The vessel is  very tortuous with 2 180 bends.  In the mid portion, the vessel is occluded.  There are RV  marginals which provide some collateral filling to the distal right system.  After the angioplasty, there is evidence of a large PDA vessel which is widely patent.  The posterior lateral artery is medium-sized and appears patent.    PCI NARRATIVE: A JR 4 guiding catheter was used to engage the right coronary artery.  A pro-water wire was advanced to the lesion but could not cross and actually disengage the guide catheter.  A Fielder XT wire was then advanced and did cross the stenosis.  The pro-water wire subsequent to cross the stenosis.  Both wires were left in for additional support due to the significant tortuosity of this vessel.  A 2.0 x 15 emerge balloon was used to predilate the lesion.  Several inflations were performed at 12 atmospheres.  A 2.5 x 38 Promus stent was advanced but would not cross the entire diseased area.  The mid right coronary artery was again predilated with the same emerge balloon to higher pressure.  The above-mentioned stent did cross at that point but was not long enough to cover the entire diseased area.  A 2.5 x 20 stent was then deployed proximally in overlapping fashion.  Both stents were deployed to 18 atmospheres.  The entire stented area was postdilated with a 3.0 x 20 Avalon Quantum balloon inflated to 18 atmospheres.  There is no residual stenosis in  the stented area.  There is some mild atherosclerosis outside the stented area but this did not appear to be hemodynamically significant.  TIMI-3 flow was restored.  The lesion length was 55 mm.  LEFT VENTRICULOGRAM:  Left ventricular angiogram was  not done to minimize contrast exposure.  Left heart catheterization was performed the pigtail catheter.    LVEDP was 18 mmHg.  IMPRESSIONS:  1.  25% ostial stenosis of the  left main coronary artery. 2.  Mild atherosclerosis in the  left anterior descending artery and its branches. 3.   Mild atherosclerosis in the  left circumflex artery and its branches. 4.  Occluded mid  right coronary artery with a long area of disease, which was the culprit vessel for her symptoms.  This was successfully treated with overlapping drug-eluting stents.  A 2.5 x 38 Promus was overlapped with a 2.5 x 20 Promus.  The entire stented area was post dilated to greater than 3 mm in diameter.  Given the long area of disease, drug-eluting stents were certainly the best treatment of choice.  It appears that her aortic aneurysm repair will be treated with the a fenestrated stent and not an open repair, so I don't think dual antiplatelet therapy will be an issue for that procedure. 5.  LVEDP 18  mmHg.  Ejection fraction not assessed to minimize contrast exposure.  6.  Mild calcific disease which made navigating up the right iliac system somewhat difficult.  A Versa core wire had to be used to cross the right iliac system.   RECOMMENDATION:  She'll be watched in the CCU.  Continue aspirin and Plavix.  I did not use one of the new antiplatelet agents due to her aneurysm.  Consider platelet function testing in the next few days.  She'll need to be started on a beta blocker.  Continue statin.  Likely add ACE inhibitor.  She needs to stop smoking as well.  We'll send records to Dr. Bennie Pierini at [facility].

## 2011-01-27 NOTE — Progress Notes (Signed)
Patient ID: Angelica Huynh, female   DOB: [birth year], 71 y.o.   MRN: [id number]    SUBJECTIVE:Stable s/p RCA PCI. No further chest pain.    Filed Vitals:   01/27/11 0630 01/27/11 0645 01/27/11 0700 01/27/11 0800  BP: 109/54 112/56 114/53   Pulse: 58 54 53   Temp:    97.7 F (36.5 C)  TempSrc:    Oral  Resp: 12 24 19    Height:      Weight:      SpO2: 97% 98% 99%     Intake/Output Summary (Last 24 hours) at 01/27/11 0810 Last data filed at 01/27/11 0800  Gross per 24 hour  Intake    744 ml  Output      0 ml  Net    744 ml    LABS: Basic Metabolic Panel:  Basename 01/27/11 0515  NA 139  K 4.4  CL 110  CO2 20  GLUCOSE 114*  BUN 15  CREATININE 1.39*  CALCIUM 8.1*  MG --  PHOS --   Liver Function Tests: No results found for this basename: AST:2,ALT:2,ALKPHOS:2,BILITOT:2,PROT:2,ALBUMIN:2 in the last 72 hours No results found for this basename: LIPASE:2,AMYLASE:2 in the last 72 hours CBC:  Basename 01/27/11 0515  WBC 5.8  NEUTROABS --  HGB 10.4*  HCT 31.3*  MCV 84.8  PLT 176   RADIOLOGY: No results found.  PHYSICAL EXAM General: NAD Neck: No JVD, no thyromegaly or thyroid nodule.  Lungs: Clear to auscultation bilaterally with normal respiratory effort. CV: Nondisplaced PMI.  Heart regular S1/S2, no S3/S4, no murmur.  No peripheral edema.  No carotid bruit.  Normal pedal pulses.  Abdomen: Soft, nontender, no hepatosplenomegaly, no distention.  Neurologic: Alert and oriented x 3.  Psych: Normal affect. Extremities: No clubbing or cyanosis.  Right groin cath site benign  TELEMETRY: Reviewed telemetry pt in NSR  ASSESSMENT AND PLAN:  71 yo with history of smoking and AAA now s/p inferior STEMI and PCI to RCA with DES x 2.  1. CAD: Stable s/p PCI.  Decrease ASA to 81.  Change statin to Crestor 40 for now, atorvastatin 80 at discharge.  Needs echo today.   2. Smoking:  Needs to quit.  3. AAA: Plan for stent graft in 2/13.  Would put this off a couple of  months.  Suspect can be done on Plavix.   4. Keep in unit today, to floor tomorrow and likely home Wednesday if progresses well. Cardiac rehab.   Marca Ancona 01/27/2011 8:13 AM

## 2011-01-27 NOTE — Progress Notes (Signed)
Cardiac Rehab 1420 Pt getting ECHO now. Will continue to follow. Larose Batres DunlapRN

## 2011-01-28 DIAGNOSIS — I219 Acute myocardial infarction, unspecified: Secondary | ICD-10-CM

## 2011-01-28 LAB — CBC
Hemoglobin: 10.3 g/dL — ABNORMAL LOW (ref 12.0–15.0)
MCH: 28.1 pg (ref 26.0–34.0)
RBC: 3.66 MIL/uL — ABNORMAL LOW (ref 3.87–5.11)
WBC: 6.2 10*3/uL (ref 4.0–10.5)

## 2011-01-28 LAB — BASIC METABOLIC PANEL
GFR calc Af Amer: 38 mL/min — ABNORMAL LOW (ref >90)
GFR calc non Af Amer: 33 mL/min — ABNORMAL LOW (ref >90)
Glucose, Bld: 91 mg/dL (ref 70–99)
Potassium: 4.2 mEq/L (ref 3.5–5.1)
Sodium: 138 mEq/L (ref 135–145)

## 2011-01-28 MED ORDER — HEPARIN SODIUM (PORCINE) 5000 UNIT/ML IJ SOLN
5000.0000 [IU] | Freq: Three times a day (TID) | INTRAMUSCULAR | Status: DC
  Filled 2011-01-28 (×4): qty 1

## 2011-01-28 MED ORDER — HEPARIN SODIUM (PORCINE) 5000 UNIT/ML IJ SOLN
5000.0000 [IU] | Freq: Three times a day (TID) | INTRAMUSCULAR | Status: DC
  Administered 2011-01-28 – 2011-01-29 (×3): 5000 [IU] via SUBCUTANEOUS
  Filled 2011-01-28 (×8): qty 1

## 2011-01-28 MED ORDER — ALBUTEROL SULFATE (5 MG/ML) 0.5% IN NEBU
2.5000 mg | INHALATION_SOLUTION | Freq: Four times a day (QID) | RESPIRATORY_TRACT | Status: DC | PRN
  Administered 2011-01-28: 2.5 mg via RESPIRATORY_TRACT
  Filled 2011-01-28: qty 0.5

## 2011-01-28 MED ORDER — IPRATROPIUM BROMIDE 0.02 % IN SOLN
0.5000 mg | RESPIRATORY_TRACT | Status: DC | PRN
  Administered 2011-01-28: 0.5 mg via RESPIRATORY_TRACT
  Filled 2011-01-28: qty 2.5

## 2011-01-28 MED ORDER — AMLODIPINE BESYLATE 5 MG PO TABS
5.0000 mg | ORAL_TABLET | Freq: Every day | ORAL | Status: DC
  Administered 2011-01-28 – 2011-01-29 (×2): 5 mg via ORAL
  Filled 2011-01-28 (×3): qty 1

## 2011-01-28 MED FILL — Dextrose Inj 5%: INTRAVENOUS | Qty: 50 | Status: AC

## 2011-01-28 NOTE — Progress Notes (Signed)
Patient ID: Angelica Huynh, female   DOB: 1940/08/14, 71 y.o.   MRN: [id number]      SUBJECTIVE:  BP on low side, decreased amlodipine this am.  Feels well today, no chest pain.  Walked with cardiac rehab.   Echo with EF 55-60%, basal inferior hypokinesis, moderate MR.      Marland Kitchen amLODipine  5 mg Oral Daily  . aspirin EC  81 mg Oral Daily  . clopidogrel  75 mg Oral Q breakfast  . levothyroxine  50 mcg Oral QAC breakfast  . loratadine  10 mg Oral Daily  . metoprolol tartrate  12.5 mg Oral BID  . rosuvastatin  40 mg Oral Daily  . DISCONTD: amLODipine  10 mg Oral Daily  . DISCONTD: aspirin EC  325 mg Oral Daily  . DISCONTD: simvastatin  40 mg Oral Daily    Filed Vitals:   01/28/11 0000 01/28/11 0100 01/28/11 0400 01/28/11 0500  BP: 115/53  117/52   Pulse: 66 54 52 51  Temp: 98.1 F (36.7 C)  98.2 F (36.8 C)   TempSrc: Oral  Oral   Resp: 20     Height:      Weight:      SpO2: 94% 95% 96% 97%    Intake/Output Summary (Last 24 hours) at 01/28/11 0726 Last data filed at 01/27/11 2000  Gross per 24 hour  Intake    495 ml  Output      0 ml  Net    495 ml    LABS: Basic Metabolic Panel:  Basename 01/28/11 0600 01/27/11 0515  NA 138 139  K 4.2 4.4  CL 108 110  CO2 23 20  GLUCOSE 91 114*  BUN 19 15  CREATININE 1.56* 1.39*  CALCIUM 8.7 8.1*  MG -- --  PHOS -- --   Liver Function Tests: No results found for this basename: AST:2,ALT:2,ALKPHOS:2,BILITOT:2,PROT:2,ALBUMIN:2 in the last 72 hours No results found for this basename: LIPASE:2,AMYLASE:2 in the last 72 hours CBC:  Basename 01/28/11 0600 01/27/11 0515  WBC 6.2 5.8  NEUTROABS -- --  HGB 10.3* 10.4*  HCT 31.3* 31.3*  MCV 85.5 84.8  PLT 164 176   RADIOLOGY: No results found.  PHYSICAL EXAM General: NAD Neck: No JVD, no thyromegaly or thyroid nodule.  Lungs: Clear to auscultation bilaterally with normal respiratory effort. CV: Nondisplaced PMI.  Heart regular S1/S2, no S3/S4, 1/6 HSM apex.  No  peripheral edema.   Abdomen: Soft, nontender, no hepatosplenomegaly, no distention.  Neurologic: Alert and oriented x 3.  Psych: Normal affect. Extremities: No clubbing or cyanosis.   TELEMETRY: Reviewed telemetry pt in NSR  ASSESSMENT AND PLAN:  71 yo with history of smoking and AAA now s/p inferior STEMI and PCI to RCA with DES x 2.  1. CAD: Stable s/p PCI.  Continue ASA, statin, beta blocker.  Holding off on ACEI right now with elevated creatinine, would eventually like to start low dose.  EF preserved on echo.   2. Smoking:  Needs to quit.  3. AAA: Plan for stent graft in 2/13.  Would put this off a couple of months.  Suspect can be done on Plavix.   4. Transfer to floor today, home tomorrow.   Marca Ancona 01/28/2011 7:26 AM

## 2011-01-28 NOTE — Progress Notes (Signed)
01/28/11 1500 UR Completed. Tera Mater, RN, BSN

## 2011-01-28 NOTE — Progress Notes (Signed)
CARDIAC REHAB PHASE I   PRE:  Rate/Rhythm: 57 SB  BP:  Supine: 110/64  Sitting:   Standing:    SaO2: 97 RA  MODE:  Ambulation: 550 ft   POST:  Rate/Rhythem: 69  BP:  Supine:   Sitting: 146/60  Standing:    SaO2: 94 RA 1045-1150 Tolerated ambulation well without c/o of cp or SOB. VS stable. Completed MI education with pt,  daughter and granddaughter.Pt agrees to Outpt. CRP in [place], will send referral.   Beatrix Fetters

## 2011-01-29 ENCOUNTER — Encounter (HOSPITAL_COMMUNITY): Payer: Self-pay | Admitting: Nurse Practitioner

## 2011-01-29 DIAGNOSIS — I2510 Atherosclerotic heart disease of native coronary artery without angina pectoris: Secondary | ICD-10-CM | POA: Insufficient documentation

## 2011-01-29 DIAGNOSIS — I10 Essential (primary) hypertension: Secondary | ICD-10-CM | POA: Insufficient documentation

## 2011-01-29 LAB — CBC
MCH: 28.8 pg (ref 26.0–34.0)
MCHC: 33.8 g/dL (ref 30.0–36.0)
MCV: 85.1 fL (ref 78.0–100.0)
Platelets: 180 10*3/uL (ref 150–400)
RDW: 13.6 % (ref 11.5–15.5)
WBC: 5.9 10*3/uL (ref 4.0–10.5)

## 2011-01-29 LAB — BASIC METABOLIC PANEL
CO2: 22 mEq/L (ref 19–32)
Calcium: 9.3 mg/dL (ref 8.4–10.5)
Creatinine, Ser: 1.63 mg/dL — ABNORMAL HIGH (ref 0.50–1.10)
Glucose, Bld: 91 mg/dL (ref 70–99)

## 2011-01-29 MED ORDER — ASPIRIN 81 MG PO TBEC: 81.0000 mg | DELAYED_RELEASE_TABLET | Freq: Every day | ORAL | Status: DC

## 2011-01-29 MED ORDER — NICOTINE 14 MG/24HR TD PT24: 1.0000 | MEDICATED_PATCH | TRANSDERMAL | Status: DC

## 2011-01-29 MED ORDER — CLOPIDOGREL BISULFATE 75 MG PO TABS: 75.0000 mg | ORAL_TABLET | Freq: Every day | ORAL | Status: DC

## 2011-01-29 MED ORDER — METOPROLOL TARTRATE 25 MG PO TABS: 12.5000 mg | ORAL_TABLET | Freq: Two times a day (BID) | ORAL | Status: DC

## 2011-01-29 MED ORDER — ATORVASTATIN CALCIUM 80 MG PO TABS: 80.0000 mg | ORAL_TABLET | Freq: Every day | ORAL | Status: DC

## 2011-01-29 NOTE — Progress Notes (Addendum)
Patient Name: Angelica Huynh Date of Encounter: 01/29/2011     Principal Problem:  *STEMI (ST elevation myocardial infarction) Active Problems:  CAD (coronary artery disease)  HYPERLIPIDEMIA  TOBACCO ABUSE  ABDOMINAL AORTIC ANEURYSM  HTN (hypertension)  Hypothyroidism  ASTHMA    SUBJECTIVE: Patient is resting comfortably in bed and is, "Feeling fine. When can I go home?" She reports being OOB and ambulating in hall without assistance and denies any SOB, CP, nausea, or dizziness. She does report general fatigue and aching back which she attributes to the bed.   CURRENT MEDS    . amLODipine  5 mg Oral Daily  . aspirin EC  81 mg Oral Daily  . clopidogrel  75 mg Oral Q breakfast  . heparin  5,000 Units Subcutaneous Q8H  . levothyroxine  50 mcg Oral QAC breakfast  . loratadine  10 mg Oral Daily  . metoprolol tartrate  12.5 mg Oral BID  . rosuvastatin  40 mg Oral Daily    OBJECTIVE  Filed Vitals:   01/28/11 0930 01/28/11 1315 01/28/11 2130 01/29/11 0508  BP: 134/61 132/72 130/75 140/75  Pulse: 56 58 68 62  Temp: 97.8 F (36.6 C) 98.6 F (37 C) 98.3 F (36.8 C) 97.7 F (36.5 C)  TempSrc: Oral Oral Oral Oral  Resp: 16 20 18 18   Height:      Weight:      SpO2: 94% 92% 92% 90%    Intake/Output Summary (Last 24 hours) at 01/29/11 0849 Last data filed at 01/29/11 0500  Gross per 24 hour  Intake   1200 ml  Output   1000 ml  Net    200 ml   PHYSICAL EXAM  General: Well developed, well nourished, in no acute distress. Head: Normocephalic, atraumatic, sclera non-icteric, no xanthomas, nares are without discharge.  Neck: Supple without bruits. JVD  Measures 6cm. Lungs:  Pt has upper respiratory pathway wheezing with intermittent wheezing throughout bilateral upper and lower lobes.  Diminished overall.  Resp regular and unlabored, easily able to carry on conversation w/o SOB. Heart: RRR normal S1, S2 ane no appreciable s3, s4,  Murmurs, rubs, or bruits Abdomen:  Soft, LLQ tender to palpation, non-distended, BS + x 4.  Msk:  Gross strength is 4+/5. Extremities: No clubbing, cyanosis or edema. Good distal perfusion with all four extremities warm to touch  DP/PT/Radials 2+ and equal bilaterally.  Right groin w/ ecchymosis but w/o bleeding, bruit, or hematoma. Neuro: Alert and oriented X 3. Moves all extremities spontaneously. Psych: Pleasant and talkative.  LABS:  CBC:  Basename 01/29/11 0503 01/28/11 0600  WBC 5.9 6.2  NEUTROABS -- --  HGB 11.2* 10.3*  HCT 33.1* 31.3*  MCV 85.1 85.5  PLT 180 164   Basic Metabolic Panel:  Basename 01/29/11 0503 01/28/11 0600  NA 137 138  K 4.2 4.2  CL 106 108  CO2 22 23  GLUCOSE 91 91  BUN 20 19  CREATININE 1.63* 1.56*  CALCIUM 9.3 8.7  MG -- --  PHOS -- --   Cardiac Enzymes:  Basename 01/27/11 0927  CKTOTAL 161  CKMB 14.7*  CKMBINDEX --  TROPONINI 10.79*   Lab Results  Component Value Date   CHOL 178 10/21/2010   HDL 50.60 10/21/2010   LDLCALC 99 10/21/2010   LDLDIRECT 132.4 12/10/2007   TRIG 143.0 10/21/2010   CHOLHDL 4 10/21/2010   Lab Results  Component Value Date   ALT 16 10/21/2010   ALT 16 10/21/2010   AST  19 10/21/2010   AST 19 10/21/2010   ALKPHOS 110 10/21/2010   ALKPHOS 110 10/21/2010   BILITOT 0.6 10/21/2010   BILITOT 0.6 10/21/2010   TELE  RSR  ASSESSMENT AND PLAN:   1. CAD/Inf STEMI: Stable s/p PCIDES - RCA.  Ambulating w/o SOB, CP,  N/v, dizziness.  Titrate statin. Cont BB, asa, plavix.  2. Tob Abuse: Asked pt about her interest in quitting and she reports she does not want to right now, declining assistance at this time. Pt educated on heart/health benefits of quitting smoking.   3. AAA: Plan for stent graft in 2/13. Will need to remain on plavix.  4.  CKD III:  Creat stable.  5.  HTN:  BP's trended higher yesterday - 130's-140's.  Suspect she will need to go back on norvasc 10 (reduced to 5 yesterday).  HCTZ previously d/c 2/2 renal dysfxn . 6. Dispo:    D/C home today. Plan for cardiac rehab.  Signed, Nicolasa Ducking NP  Patient seen with PA, agree with note.  Increase amlodipine back to 10.  Change statin at home to atorvastatin 80.  Needs to quit smoking, agrees to try nicotine patch.  She will not be able to do cardiac rehab until large AAA is repaired. I recommended that she try to reschedule the AAA repair until March.  Hopefully this can be done on Plavix.  Will send note to Dr. Pattricia Boss in [facility] who is planning the procedure.   Marca Ancona 01/29/2011 11:00 AM

## 2011-01-29 NOTE — Progress Notes (Signed)
   CARE MANAGEMENT NOTE 01/29/2011  Patient:  Angelica Huynh, Angelica Huynh   Account Number:  [id number]  Date Initiated:  01/28/2011  Documentation initiated by:  Tera Mater  Subjective/Objective Assessment:   DX:  CODE STEMI     Action/Plan:   discharge planning   Anticipated DC Date:  01/31/2011   Anticipated DC Plan:  HOME W HOME HEALTH SERVICES      DC Planning Services  CM consult      Choice offered to / List presented to:             Status of service:  Completed, signed off Medicare Important Message given?   (If response is "NO", the following Medicare IM given date fields will be blank) Date Medicare IM given:   Date Additional Medicare IM given:    Discharge Disposition:  HOME/SELF CARE  Per UR Regulation:  Reviewed for med. necessity/level of care/duration of stay  Comments:  01-29-11 [address] [place] [phone] No CM needs at this time. Plan for d/c home on plavix. No medicaiton needs.  01/28/11 1500 UR Completed. Tera Mater, RN, BSN

## 2011-01-29 NOTE — Discharge Summary (Signed)
Patient ID: Angelica Huynh,  MRN: [id number], DOB/AGE: 1940-05-08 71 y.o.  Admit date: 01/27/2011 Discharge date: 01/29/2011  Primary Care Provider: M. Norins  Primary Cardiologist: [facility]  Discharge Diagnoses Principal Problem:  *Inferior STEMI (ST elevation myocardial infarction) Active Problems:  CAD (coronary artery disease) - s/p DES x 2 to RCA  HYPERLIPIDEMIA  TOBACCO ABUSE  ABDOMINAL AORTIC ANEURYSM  HTN (hypertension)  Hypothyroidism  ASTHMA   Allergies Allergies  Allergen Reactions  . Sulfonamide Derivatives Hives    Severe itching    Procedures  Cardiac Catheterization and PCI 01/27/2011  The left main coronary artery has an ostial 25% stenosis. There was no pressure drop across the stenosis with the catheter.  The left anterior descending artery is a medium-size vessel. In the proximal LAD, there is mild atherosclerosis. In the mid LAD at the origin of the first diagonal, there is a 25% focal lesion. The first diagonal is a medium-sized vessel which appears widely patent. The septal perforators provide collateral flow to the distal right coronary system..  The left circumflex artery is a large vessel. The proximal vessel is quite tortuous. There is a large branching OM1 which has mild to moderate disease at the bifurcation. The lower pole of the OM is a larger vessel. There is no hemodynamically significant disease..  The right coronary artery is a medium size vessel. The vessel is very tortuous with 2 180 bends. In the mid portion, the vessel is occluded. There are RV marginals which provide some collateral filling to the distal right system. After the angioplasty, there is evidence of a large PDA vessel which is widely patent. The posterior lateral artery is medium-sized and appears patent.   **The RCA was successfully stented with a 2.5 x 38mm Promus DES and a 2.5 x 20mm Promus DES ______________________________________________________  2D Echocardiogram  01/27/2011  Study Conclusions  - Left ventricle: The cavity size was normal. Wall thickness was normal. Systolic function was normal. The estimated ejection fraction was in the range of 55% to 60%. There is hypokinesis of the basalinferior myocardium. Doppler parameters are consistent with abnormal left ventricular relaxation (grade 1 diastolic dysfunction). - Mitral valve: Moderate regurgitation. - Pulmonary arteries: Systolic pressure was mildly increased. PA peak pressure: 37mm Hg (S).  ______________________________________________________   History of Present Illness  71 y/o female without prior cardiac history who over a 3 day period prior to admission noted intermittent chest and epigastric discomfort.  On the evening of admission, pt awoke suddenly with severe sscp.  She called EMS and was taken to [facility] where ECG showed Inferior ST elevation.  Code STEMI was activated and pt was taken emergently to [facility] for emergent cath and further evaluation.  Hospital Course   Pt underwent emergent cardiac catheterization revealing a 100% occlusion within the RCA.  She otherwise had non-obstructive CAD.  The RCA was successfully stented using 2 Promus DES's as outlined above.  She tolerated procedure well and post-PCI was monitored in the coronary intensive care unit.  She ruled in for MI and was maintained on asa, plavix, beta-blocker, and statin therapy.  She had no further chest discomfort and 2D echo was performed and showed normal LV function.  She was transferred out to the floor on 01/28/2011 and has been ambulating with cardiac rehab w/o recurrent chest pain or dyspnea.  We have noted borderline oxygen saturations ranging from 90-92% on room air.  She has been counseled on the importance of smoking cessation.  Further, we  have recommended that she put off AAA stent grafting for at least 1 month.  She will be d/c today in good condition.  Discharge Vitals:  Blood pressure  140/75, pulse 62, temperature 97.7 F (36.5 C), temperature source Oral, resp. rate 18, height 5\' 1"  (1.549 m), weight 122 lb 2.2 oz (55.4 kg), SpO2 90.00%.   Labs: CBC:  Basename 01/29/11 0503 01/28/11 0600  WBC 5.9 6.2  NEUTROABS -- --  HGB 11.2* 10.3*  HCT 33.1* 31.3*  MCV 85.1 85.5  PLT 180 164   Basic Metabolic Panel:  Basename 01/29/11 0503 01/28/11 0600  NA 137 138  K 4.2 4.2  CL 106 108  CO2 22 23  GLUCOSE 91 91  BUN 20 19  CREATININE 1.63* 1.56*  CALCIUM 9.3 8.7  MG -- --  PHOS -- --   Cardiac Enzymes:  Basename 01/27/11 0927  CKTOTAL 161  CKMB 14.7*  CKMBINDEX --  TROPONINI 10.79*    Disposition:  Follow-up Information    Follow up with Marca Ancona, MD on 02/12/2011. (3:45)    Contact information:   [address] [place] [address] [phone]       Follow up with Illene Regulus, MD. (as scheduled)    Contact information:   [address] [place] [address] [phone]       Follow up with Griffin Dakin. (as scheduled)    Contact information:   [facility]      Follow up with [facility] on 02/06/2011. (1:00 - Pulmonary Function Tests)    Contact information:   [address] [place] [address] [phone]         Discharge Medications:  Medication List  As of 01/29/2011 11:06 AM   STOP taking these medications         furosemide 20 MG tablet      KLOR-CON M20 20 MEQ tablet         TAKE these medications         amLODipine 10 MG tablet   Commonly known as: NORVASC   Take 10 mg by mouth daily.      aspirin 81 MG EC tablet   Take 1 tablet (81 mg total) by mouth daily.      atorvastatin 80 MG tablet   Commonly known as: LIPITOR   Take 1 tablet (80 mg total) by mouth daily.      clopidogrel 75 MG tablet   Commonly known as: PLAVIX   Take 1 tablet (75 mg total) by mouth daily with breakfast.      ibandronate 150 MG tablet    Commonly known as: BONIVA   Take 150 mg by mouth every 30 (thirty) days. Take in the morning with a full glass of water, on an empty stomach, and do not take anything else by mouth or lie down for the next 30 min.      levothyroxine 50 MCG tablet   Commonly known as: SYNTHROID, LEVOTHROID   TAKE ONE TABLET BY MOUTH ONE TIME DAILY      metoprolol tartrate 25 MG tablet   Commonly known as: LOPRESSOR   Take 0.5 tablets (12.5 mg total) by mouth 2 (two) times daily.      nicotine 14 mg/24hr patch   Commonly known as: NICODERM CQ - dosed in mg/24 hours   Place 1 patch onto the skin daily.  Outstanding Labs/Studies  F/U lipids/lft's in 6-8 weeks.  F/U PFT's  Duration of Discharge Encounter: Greater than 30 minutes including physician time.  Signed, Nicolasa Ducking NP 01/29/2011, 11:06 AM

## 2011-02-06 ENCOUNTER — Ambulatory Visit (INDEPENDENT_AMBULATORY_CARE_PROVIDER_SITE_OTHER): Payer: Medicare Other | Admitting: Internal Medicine

## 2011-02-06 DIAGNOSIS — J45909 Unspecified asthma, uncomplicated: Secondary | ICD-10-CM

## 2011-02-06 LAB — PULMONARY FUNCTION TEST

## 2011-02-06 NOTE — Progress Notes (Signed)
PFT done today. 

## 2011-02-10 ENCOUNTER — Ambulatory Visit (INDEPENDENT_AMBULATORY_CARE_PROVIDER_SITE_OTHER): Payer: Medicare Other | Admitting: Cardiology

## 2011-02-10 ENCOUNTER — Encounter: Payer: Self-pay | Admitting: Cardiology

## 2011-02-10 DIAGNOSIS — I2510 Atherosclerotic heart disease of native coronary artery without angina pectoris: Secondary | ICD-10-CM

## 2011-02-10 DIAGNOSIS — J449 Chronic obstructive pulmonary disease, unspecified: Secondary | ICD-10-CM | POA: Insufficient documentation

## 2011-02-10 DIAGNOSIS — E785 Hyperlipidemia, unspecified: Secondary | ICD-10-CM

## 2011-02-10 DIAGNOSIS — I714 Abdominal aortic aneurysm, without rupture: Secondary | ICD-10-CM

## 2011-02-10 MED ORDER — TIOTROPIUM BROMIDE MONOHYDRATE 18 MCG IN CAPS: 18.0000 ug | ORAL_CAPSULE | Freq: Every day | RESPIRATORY_TRACT | Status: DC

## 2011-02-10 NOTE — Assessment & Plan Note (Signed)
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  Mildly decreased oxygen saturation in hospital.  She has now quit smoking.  I will let her try Spiriva.  If no response, she can stop it.

## 2011-02-10 NOTE — Assessment & Plan Note (Addendum)
S/p inferior STEMI.  EF preserved on echo.  She has quit smoking.  Continue ASA 81, atorvastatin 80, Plavix 75, metoprolol.  She is not on ACEI given CKD. Cardiac rehab after stent graft repair of AAA.

## 2011-02-10 NOTE — Patient Instructions (Signed)
Use Spiriva daily.  Your physician recommends that you return for a FASTING lipid profile /liver profile/BMET in March 2013.  Your physician recommends that you schedule a follow-up appointment in: 2 months with Dr. Shirlee Latch.

## 2011-02-10 NOTE — Assessment & Plan Note (Signed)
Goal LDL < 70.  Check lipids in 3/13.

## 2011-02-10 NOTE — Progress Notes (Signed)
PCP: Dr. Debby Bud  71 yo with history of COPD, CAD s/p recent inferior MI and AAA presents for cardiology followup.  Patient has a 5.6 cm AAA that needs repair.  She does not have any back or abdominal pain.  She was admitted in 1/13 with an acute inferior MI.  She had DES x 2 to the RCA.  Since this hospitalization, she has quit smoking.  Echo showed preserved LV systolic function with basal inferior hypokinesis.  In the hospital, I noted that her oxygen saturation was on the low side.  She did not require home oxygen.  She did not appear volume overloaded.  PFTs showed mild COPD responsive to bronchodilator.    Since discharge from the hospital, she has been doing well.  No recurrent chest pain.  She does not report significant exertional dyspnea.  She can walk up steps without problem.   ECG: NSR, inferior TWIs  Labs (10/12): creatinine 1.5, LDL 99, HDL 51 Labs (1/13): K 4.2, creatinine 1.63  PMH: 1. AAA: 5.5 cm by [place] 11/12, 5.6 cm by CT 11/12.  2. Significant aortic atherosclerosis on CT angiogram of abdomen/pelvis 11/12.  3. Asthma/COPD: PFTs with mild obstructive defect responsive to bronchodilator (1/13).  4. HTN 5. Osteoporosis 6. Appendectomy.  7. Migraines 8. Hypothyroidism 9. Hyperlipidemia 10. CKD 11. CAD: Inferior MI 1/13.  Left heart cath showed total occlusion proximal RCA, treated with 2 Promus DES.  Echo (1/13) with EF 55-60%, basal inferior hypokinesis, moderate MR, PA systolic pressure 37 mmHg.  12. Moderate MR on echo 1/13.   SH: Widow, lives in [place]). 3 kids.  Quit smoking in 1/13  FH: Father, half brother, and brother all had AAAs.  Mother with MI at 83, father with MI in his late 55s, brother with MI.   ROS: All systems reviewed and negative except as per HPI.   Current Outpatient Prescriptions  Medication Sig Dispense Refill  . amLODipine (NORVASC) 10 MG tablet Take 10 mg by mouth daily.        Marland Kitchen aspirin EC 81 MG EC tablet Take 1 tablet  (81 mg total) by mouth daily.  30 tablet  6  . atorvastatin (LIPITOR) 80 MG tablet Take 1 tablet (80 mg total) by mouth daily.  30 tablet  6  . clopidogrel (PLAVIX) 75 MG tablet Take 1 tablet (75 mg total) by mouth daily with breakfast.  30 tablet  6  . ibandronate (BONIVA) 150 MG tablet Take 150 mg by mouth every 30 (thirty) days. Take in the morning with a full glass of water, on an empty stomach, and do not take anything else by mouth or lie down for the next 30 min.       Marland Kitchen levothyroxine (SYNTHROID, LEVOTHROID) 50 MCG tablet TAKE ONE TABLET BY MOUTH ONE TIME DAILY  30 tablet  4  . metoprolol tartrate (LOPRESSOR) 25 MG tablet Take 0.5 tablets (12.5 mg total) by mouth 2 (two) times daily.  30 tablet  6  . tiotropium (SPIRIVA HANDIHALER) 18 MCG inhalation capsule Place 1 capsule (18 mcg total) into inhaler and inhale daily.  30 capsule  12    BP 132/66  Pulse 46  Ht 5\' 2"  (1.575 m)  Wt 55.974 kg (123 lb 6.4 oz)  BMI 22.57 kg/m2 General: NAD Neck: No JVD, no thyromegaly or thyroid nodule.  Lungs: Clear to auscultation bilaterally with normal respiratory effort. CV: Nondisplaced PMI.  Heart regular S1/S2, no S3/S4, 1/6 HSM at apex.  No peripheral edema.  No carotid bruit.  Normal pedal pulses.  Abdomen: Soft, nontender, no hepatosplenomegaly, no distention.  Neurologic: Alert and oriented x 3.  Psych: Normal affect. Extremities: No clubbing or cyanosis.

## 2011-02-10 NOTE — Assessment & Plan Note (Signed)
Needs repair.  Stent graft planned at [facility] with Dr. Pattricia Boss.  I think she will be ok to go for this in March after some recovery from MI.  Needs to stay on Plavix (expect this can be done given percutaneous procedure).

## 2011-02-12 ENCOUNTER — Encounter: Payer: Medicare Other | Admitting: Cardiology

## 2011-02-14 ENCOUNTER — Emergency Department (HOSPITAL_COMMUNITY): Payer: Medicare Other

## 2011-02-14 ENCOUNTER — Other Ambulatory Visit: Payer: Self-pay

## 2011-02-14 ENCOUNTER — Emergency Department (HOSPITAL_COMMUNITY)
Admission: EM | Admit: 2011-02-14 | Discharge: 2011-02-15 | Disposition: A | Discharge status: Home / Self Care | Payer: Medicare Other | Attending: Emergency Medicine | Admitting: Emergency Medicine

## 2011-02-14 ENCOUNTER — Encounter (HOSPITAL_COMMUNITY): Payer: Self-pay | Admitting: Emergency Medicine

## 2011-02-14 DIAGNOSIS — I10 Essential (primary) hypertension: Secondary | ICD-10-CM | POA: Insufficient documentation

## 2011-02-14 DIAGNOSIS — R600 Localized edema: Secondary | ICD-10-CM

## 2011-02-14 DIAGNOSIS — F17200 Nicotine dependence, unspecified, uncomplicated: Secondary | ICD-10-CM | POA: Insufficient documentation

## 2011-02-14 DIAGNOSIS — E039 Hypothyroidism, unspecified: Secondary | ICD-10-CM | POA: Insufficient documentation

## 2011-02-14 DIAGNOSIS — M7989 Other specified soft tissue disorders: Secondary | ICD-10-CM | POA: Insufficient documentation

## 2011-02-14 DIAGNOSIS — R609 Edema, unspecified: Secondary | ICD-10-CM | POA: Insufficient documentation

## 2011-02-14 DIAGNOSIS — E785 Hyperlipidemia, unspecified: Secondary | ICD-10-CM | POA: Insufficient documentation

## 2011-02-14 DIAGNOSIS — J45909 Unspecified asthma, uncomplicated: Secondary | ICD-10-CM | POA: Insufficient documentation

## 2011-02-14 HISTORY — DX: Acute myocardial infarction, unspecified: I21.9

## 2011-02-14 NOTE — ED Notes (Signed)
Pt c/o swelling in bil hands and bil lower leg and ankle swelling onset earlier today.   Pt also c/o feeling dizzy when she stands onset earlier.   Pt denies chest pain or shortness of breath

## 2011-02-15 DIAGNOSIS — M7989 Other specified soft tissue disorders: Secondary | ICD-10-CM

## 2011-02-15 LAB — DIFFERENTIAL
Basophils Absolute: 0 10*3/uL (ref 0.0–0.1)
Basophils Relative: 1 % (ref 0–1)
Neutro Abs: 4.8 10*3/uL (ref 1.7–7.7)
Neutrophils Relative %: 64 % (ref 43–77)

## 2011-02-15 LAB — URINALYSIS, ROUTINE W REFLEX MICROSCOPIC
Bilirubin Urine: NEGATIVE
Nitrite: NEGATIVE
Protein, ur: 100 mg/dL — AB
Specific Gravity, Urine: 1.005 (ref 1.005–1.030)
Urobilinogen, UA: 0.2 mg/dL (ref 0.0–1.0)

## 2011-02-15 LAB — POCT I-STAT, CHEM 8
Glucose, Bld: 103 mg/dL — ABNORMAL HIGH (ref 70–99)
HCT: 35 % — ABNORMAL LOW (ref 36.0–46.0)
Hemoglobin: 11.9 g/dL — ABNORMAL LOW (ref 12.0–15.0)
Potassium: 4.3 mEq/L (ref 3.5–5.1)
Sodium: 137 mEq/L (ref 135–145)
TCO2: 21 mmol/L (ref 0–100)

## 2011-02-15 LAB — CBC
MCHC: 34.3 g/dL (ref 30.0–36.0)
Platelets: 208 10*3/uL (ref 150–400)
RDW: 13.6 % (ref 11.5–15.5)

## 2011-02-15 LAB — URINE MICROSCOPIC-ADD ON

## 2011-02-15 NOTE — Progress Notes (Signed)
Bilateral lower extremity venous duplex completed.  Preliminary report is negative for DVT, SVT, or a Baker's cyst. 

## 2011-02-15 NOTE — ED Provider Notes (Signed)
Medical screening examination/treatment/procedure(s) were conducted as a shared visit with non-physician practitioner(s) and myself.  I personally evaluated the patient during the encounter RRR CTAB  NABS RL edema slight > left  Shayma Pfefferle K Kaianna Dolezal-Rasch, MD 02/15/11 [phone]

## 2011-02-15 NOTE — ED Provider Notes (Signed)
Assume pt care from Sabino Dick, NP @ [phone].  Bilateral leg swelling.  If [place] normal, call cardiology to decide whether to use Lasix or not.  If [place] positive, then CTA, then contact cardiologist.  (cardiology: [place]).  No SOB.  UA shows WBC 11-20 with moderate leukocyte.  Culture sent, no abx at this time.  BUN 29, Cr 1.8 (elevated from 2 weeks ago).\  Patient states she had a heart attack 3 weeks ago. She is currently being cared by [facility] cardiology. Patient is in no acute distress. Her heart is with regular rate and rhythm, lung is clear to auscultation bilaterally, abdomen is soft and nontender. No extremity without significant edema. Pedal pulse 2+ bilaterally. No calf pain.  9:29 AM Doppler of lower extremity were negative for DVT. With an elevated kidney functions, BUN 29, creatinine 1.80, and patient has minimal lower extremity swelling, we will not start patient on Lasix at this time. I instructed for her to follow up with cardiology for further management. I have discussed with my attending who agrees with plan.    Fayrene Helper, PA-C 02/15/11 [phone]

## 2011-02-15 NOTE — ED Notes (Signed)
Pt resting quietly at the time. Vital signs stable. No signs of distress noted. Family at bedside. 

## 2011-02-15 NOTE — ED Notes (Addendum)
Pt resting quietly at the time. No signs of distress.  

## 2011-02-15 NOTE — ED Notes (Signed)
Pt discharged home, denies pain. Had no further questions, encouraged to follow up with PCP. Vital signs stable.

## 2011-02-15 NOTE — ED Notes (Signed)
MD at bedside. 

## 2011-02-15 NOTE — ED Provider Notes (Addendum)
History     CSN: [id number]  Arrival date & time 02/14/11  2134   First MD Initiated Contact with Patient 02/14/11 2326      Chief Complaint  Patient presents with  . Leg Swelling    (Consider location/radiation/quality/duration/timing/severity/associated sxs/prior treatment) HPI  Past Medical History  Diagnosis Date  . Pneumonia     Childhood  . Asthma     In remission  . Asthmatic bronchitis   . HTN (hypertension)   . Osteoporosis   . Hyperlipidemia   . Thyroid disease   . AAA (abdominal aortic aneurysm)   . Ocular migraine   . Hypothyroidism   . CAD (coronary artery disease)     A. 01/2011 - Inf STEMI - s/p 2 Promus DES to RCA (2.33mm)  . Tobacco abuse   . MI (myocardial infarction)     Past Surgical History  Procedure Date  . Appendectomy   . Tubal ligation   . Knee arthroscopy   . Orif wrist fracture     LEFT  . Breast surgery     bilateral lumpectomies    Family History  Problem Relation Age of Onset  . Heart attack Mother   . Cervical cancer Mother   . Cancer Mother     cervical Cancer  . Heart attack Father   . Heart disease Father     AAA-rupture, MI  . Heart attack Brother   . Heart disease Brother     AAA rupture  . Breast cancer Maternal Aunt   . Cancer Maternal Aunt     breast    History  Substance Use Topics  . Smoking status: Former Smoker -- 0.5 packs/day for 25 years    Types: Cigarettes    Quit date: 01/10/2011  . Smokeless tobacco: Never Used  . Alcohol Use: No    OB History    Grav Para Term Preterm Abortions TAB SAB Ect Mult Living                  Review of Systems  Constitutional: Negative for fever.  Respiratory: Negative for shortness of breath.   Cardiovascular: Positive for leg swelling.    Allergies  Sulfonamide derivatives  Home Medications   Current Outpatient Rx  Name Route Sig Dispense Refill  . AMLODIPINE BESYLATE 10 MG PO TABS Oral Take 10 mg by mouth daily.      . ASPIRIN 81 MG PO TBEC Oral  Take 81 mg by mouth daily.    . ATORVASTATIN CALCIUM 80 MG PO TABS Oral Take 80 mg by mouth daily.    Marland Kitchen CLOPIDOGREL BISULFATE 75 MG PO TABS Oral Take 75 mg by mouth daily with breakfast.    . IBANDRONATE SODIUM 150 MG PO TABS Oral Take 150 mg by mouth every 30 (thirty) days. Take in the morning with a full glass of water, on an empty stomach, and do not take anything else by mouth or lie down for the next 30 min.     Marland Kitchen LEVOTHYROXINE SODIUM 50 MCG PO TABS Oral Take 50 mcg by mouth daily.    Marland Kitchen METOPROLOL TARTRATE 25 MG PO TABS Oral Take 12.5 mg by mouth 2 (two) times daily.    Marland Kitchen TIOTROPIUM BROMIDE MONOHYDRATE 18 MCG IN CAPS Inhalation Place 18 mcg into inhaler and inhale daily.      BP 158/74  Pulse 54  Temp(Src) 98.5 F (36.9 C) (Oral)  Resp 20  SpO2 96%  Physical Exam  Constitutional: She appears  well-developed.  HENT:  Head: Normocephalic.  Right Ear: External ear normal.  Neck: Normal range of motion.  Cardiovascular: Normal rate.   Pulmonary/Chest: Effort normal.  Musculoskeletal: She exhibits edema and tenderness.    ED Course  Procedures (including critical care time)  Labs Reviewed  CBC - Abnormal; Notable for the following:    HCT 35.9 (*)    All other components within normal limits  URINALYSIS, ROUTINE W REFLEX MICROSCOPIC - Abnormal; Notable for the following:    Hgb urine dipstick TRACE (*)    Protein, ur 100 (*)    Leukocytes, UA MODERATE (*)    All other components within normal limits  POCT I-STAT, CHEM 8 - Abnormal; Notable for the following:    BUN 29 (*)    Creatinine, Ser 1.80 (*)    Glucose, Bld 103 (*)    Hemoglobin 11.9 (*)    HCT 35.0 (*)    All other components within normal limits  D-DIMER, QUANTITATIVE - Abnormal; Notable for the following:    D-Dimer, Quant 2.14 (*)    All other components within normal limits  DIFFERENTIAL  URINE MICROSCOPIC-ADD ON   Dg Chest 2 View  02/15/2011  *RADIOLOGY REPORT*  Clinical Data: MI 3 weeks ago.  Fluid  retention in the feet.  CHEST - 2 VIEW  Comparison: 10/21/2010  Findings: Normal heart size and pulmonary vascularity.  Mild hyperinflation.  Scattered calcified granulomas in the lungs.  No focal airspace consolidation.  No blunting of costophrenic angles. No pneumothorax.  No significant change since previous study. Incidental note of coronary artery stent.  IMPRESSION: No evidence of active pulmonary disease.  Original Report Authenticated By: Marlon Pel, M.D.     No diagnosis found.    MDM  D-dimer was added after Dr. Daun Peacock, examined patient.  It is +2.4.  We'll hold patient here until morning and obtain ultrasound of right lower leg to rule out DVT.  Will hold on any anticoagulants at this time        Arman Filter, NP 02/15/11 0256  Arman Filter, NP 03/05/11 2056

## 2011-02-15 NOTE — ED Notes (Signed)
Vascular tech at bedside to perform doppler study. 

## 2011-02-15 NOTE — ED Provider Notes (Signed)
Medical screening examination/treatment/procedure(s) were performed by non-physician practitioner and as supervising physician I was immediately available for consultation/collaboration.  Jasmine Awe, MD 02/15/11 2311

## 2011-02-17 ENCOUNTER — Telehealth: Payer: Self-pay | Admitting: Cardiology

## 2011-02-17 NOTE — Telephone Encounter (Signed)
Reviewed with Dr Shirlee Latch. Dr Shirlee Latch reviewed ED notes from 02/15/11. He did not recommend pt restart lasix if she did not have increase in SOB. I talked with pt. Pt denies SOB. Pt is aware Dr Shirlee Latch did not recommend restarting lasix at this time.  We discussed keeping feet and legs elevated, limiting sodium in diet, and using support hose. Pt to call if changes.

## 2011-02-17 NOTE — Telephone Encounter (Signed)
Notes from ED 02/15/11 state BUN 29 Cr 1.8 so they did not start Lasix. I will forward to Dr Shirlee Latch for review.

## 2011-02-17 NOTE — Telephone Encounter (Signed)
Talked with pt. Pt states she has bilateral swelling in her feet and ankles since Friday. She was worried about a blood clot so she went to [facility] ED and was told she did not have blood clot. Pt states she continues to have swelling in her feet and ankles. She does not weigh daily.  She denies any increase in SOB. She has been on Lasix 20mg  in the past and is asking if she can restart this.

## 2011-02-17 NOTE — Telephone Encounter (Signed)
New Msg: pt calling wanting to speak with nurse/md regarding pt legs and feet swelling as of Friday. Pt wanted to speak with MD about going back on a diuretic.    Please return pt call to discuss further.

## 2011-03-05 NOTE — ED Provider Notes (Signed)
Medical screening examination/treatment/procedure(s) were performed by non-physician practitioner and as supervising physician I was immediately available for consultation/collaboration.  Jasmine Awe, MD 03/05/11 [phone]

## 2011-03-12 ENCOUNTER — Other Ambulatory Visit: Payer: Medicare Other

## 2011-03-13 ENCOUNTER — Other Ambulatory Visit (INDEPENDENT_AMBULATORY_CARE_PROVIDER_SITE_OTHER): Payer: Medicare Other

## 2011-03-13 DIAGNOSIS — I2510 Atherosclerotic heart disease of native coronary artery without angina pectoris: Secondary | ICD-10-CM

## 2011-03-13 LAB — BASIC METABOLIC PANEL
CO2: 23 mEq/L (ref 19–32)
Calcium: 9.1 mg/dL (ref 8.4–10.5)
Creatinine, Ser: 1.8 mg/dL — ABNORMAL HIGH (ref 0.4–1.2)
Glucose, Bld: 86 mg/dL (ref 70–99)

## 2011-03-13 LAB — HEPATIC FUNCTION PANEL
AST: 21 U/L (ref 0–37)
Alkaline Phosphatase: 124 U/L — ABNORMAL HIGH (ref 39–117)
Bilirubin, Direct: 0.1 mg/dL (ref 0.0–0.3)
Total Bilirubin: 0.3 mg/dL (ref 0.3–1.2)

## 2011-03-13 LAB — LIPID PANEL: Total CHOL/HDL Ratio: 3

## 2011-03-27 ENCOUNTER — Other Ambulatory Visit: Payer: Self-pay | Admitting: Internal Medicine

## 2011-04-15 ENCOUNTER — Ambulatory Visit (INDEPENDENT_AMBULATORY_CARE_PROVIDER_SITE_OTHER): Payer: Medicare Other | Admitting: Cardiology

## 2011-04-15 ENCOUNTER — Encounter: Payer: Self-pay | Admitting: Cardiology

## 2011-04-15 VITALS — BP 140/62 | HR 56 | Ht 61.0 in | Wt 128.0 lb

## 2011-04-15 DIAGNOSIS — I714 Abdominal aortic aneurysm, without rupture, unspecified: Secondary | ICD-10-CM

## 2011-04-15 DIAGNOSIS — I25810 Atherosclerosis of coronary artery bypass graft(s) without angina pectoris: Secondary | ICD-10-CM

## 2011-04-15 DIAGNOSIS — I2510 Atherosclerotic heart disease of native coronary artery without angina pectoris: Secondary | ICD-10-CM

## 2011-04-15 DIAGNOSIS — E785 Hyperlipidemia, unspecified: Secondary | ICD-10-CM

## 2011-04-15 DIAGNOSIS — J4489 Other specified chronic obstructive pulmonary disease: Secondary | ICD-10-CM

## 2011-04-15 DIAGNOSIS — I10 Essential (primary) hypertension: Secondary | ICD-10-CM

## 2011-04-15 DIAGNOSIS — J449 Chronic obstructive pulmonary disease, unspecified: Secondary | ICD-10-CM

## 2011-04-15 MED ORDER — HYDRALAZINE HCL 25 MG PO TABS: 25.0000 mg | ORAL_TABLET | Freq: Three times a day (TID) | ORAL | Status: DC

## 2011-04-15 NOTE — Patient Instructions (Signed)
Start hydralazine 25mg  three times a day.  Your physician wants you to follow-up in: 6 months with Dr Shirlee Latch. (October 2013). You will receive a reminder letter in the mail two months in advance. If you don't receive a letter, please call our office to schedule the follow-up appointment.   Your physician recommends that you return for a FASTING lipid profile /liver profile in 6 months when you see Dr Shirlee Latch.

## 2011-04-16 NOTE — Assessment & Plan Note (Signed)
Angelica Huynh will need to come off Plavix for her AAA stent-grafting.  She had Promus DES x 2 to her RCA.  With this stent, she should be able to come off Plavix temporarily to have the AAA stent grafting in July (after 6 months of Plavix).  She now has this rescheduled for 7/30.

## 2011-04-16 NOTE — Assessment & Plan Note (Signed)
S/p inferior STEMI.  EF preserved on echo.  She has quit smoking.  Continue ASA 81, atorvastatin 80, Plavix 75, metoprolol.  She is not on ACEI given CKD. Cardiac rehab after stent graft repair of AAA.

## 2011-04-16 NOTE — Progress Notes (Signed)
PCP: Dr. Debby Bud  71 yo with history of COPD, CAD s/p recent inferior MI and AAA presents for cardiology followup.  Patient has a 5.6 cm AAA that needs repair.  She does not have any back or abdominal pain.  She was admitted in 1/13 with an acute inferior MI.  She had DES x 2 to the RCA.  Since this hospitalization, she has quit smoking.  Echo showed preserved LV systolic function with basal inferior hypokinesis.  In the hospital, I noted that her oxygen saturation was on the low side.  She did not require home oxygen.  She did not appear volume overloaded.  PFTs showed mild COPD responsive to bronchodilator.    Since discharge from the hospital, she has been doing well.  No recurrent chest pain.  She does not report significant exertional dyspnea.  She can walk up steps without problem.  She notes easy bruising now on Plavix.  Systolic BP has been running in the 150s at home and is 140/62 today.   Labs (10/12): creatinine 1.5, LDL 99, HDL 51 Labs (1/13): K 4.2, creatinine 1.63 Labs (3/13): K 4.7, creatinine 1.8, LDL 45, HDL 40  PMH: 1. AAA: 5.5 cm by [place] 11/12, 5.6 cm by CT 11/12.  2. Significant aortic atherosclerosis on CT angiogram of abdomen/pelvis 11/12.  3. Asthma/COPD: PFTs with mild obstructive defect responsive to bronchodilator (1/13).  4. HTN 5. Osteoporosis 6. Appendectomy.  7. Migraines 8. Hypothyroidism 9. Hyperlipidemia 10. CKD 11. CAD: Inferior MI 1/13.  Left heart cath showed total occlusion proximal RCA, treated with 2 Promus DES.  Echo (1/13) with EF 55-60%, basal inferior hypokinesis, moderate MR, PA systolic pressure 37 mmHg.  12. Moderate MR on echo 1/13.   SH: Widow, lives in [place]). 3 kids.  Quit smoking in 1/13  FH: Father, half brother, and brother all had AAAs.  Mother with MI at 78, father with MI in his late 67s, brother with MI.   ROS: All systems reviewed and negative except as per HPI.   Current Outpatient Prescriptions  Medication  Sig Dispense Refill  . amLODipine (NORVASC) 10 MG tablet Take 10 mg by mouth daily.        Marland Kitchen aspirin 81 MG EC tablet Take 81 mg by mouth daily.      Marland Kitchen atorvastatin (LIPITOR) 80 MG tablet Take 80 mg by mouth daily.      . clopidogrel (PLAVIX) 75 MG tablet Take 75 mg by mouth daily with breakfast.      . ibandronate (BONIVA) 150 MG tablet Take 150 mg by mouth every 30 (thirty) days. Take in the morning with a full glass of water, on an empty stomach, and do not take anything else by mouth or lie down for the next 30 min.       Marland Kitchen levothyroxine (SYNTHROID, LEVOTHROID) 50 MCG tablet TAKE ONE TABLET BY MOUTH ONE TIME DAILY  30 tablet  4  . metoprolol tartrate (LOPRESSOR) 25 MG tablet Take 12.5 mg by mouth 2 (two) times daily.      . hydrALAZINE (APRESOLINE) 25 MG tablet Take 1 tablet (25 mg total) by mouth 3 (three) times daily.  90 tablet  6    BP 140/62  Pulse 56  Ht 5\' 1"  (1.549 m)  Wt 128 lb (58.06 kg)  BMI 24.19 kg/m2 General: NAD Neck: No JVD, no thyromegaly or thyroid nodule.  Lungs: Clear to auscultation bilaterally with normal respiratory effort. CV: Nondisplaced PMI.  Heart regular  S1/S2, no S3/S4, 1/6 HSM at apex.  No peripheral edema.  No carotid bruit.  Normal pedal pulses.  Abdomen: Soft, nontender, no hepatosplenomegaly, no distention.  Neurologic: Alert and oriented x 3.  Psych: Normal affect. Extremities: No clubbing or cyanosis.

## 2011-04-16 NOTE — Assessment & Plan Note (Signed)
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  Mildly decreased oxygen saturation in hospital.  She has now quit smoking.

## 2011-04-16 NOTE — Assessment & Plan Note (Signed)
Lipids/LFTs to be checked in 6 months when she returns to the office.  Goal LDL < 70.

## 2011-04-16 NOTE — Assessment & Plan Note (Signed)
BP has been running high.  Will avoid diuretic and ACEI for now with elevated creatinine.  Will not increase beta blocker with mild bradycardia (HR in 50s).  She is on max Norvasc. I will add hydralazine 25 mg tid, this can be titrated up.

## 2011-04-30 ENCOUNTER — Other Ambulatory Visit: Payer: Self-pay | Admitting: Internal Medicine

## 2011-07-07 HISTORY — PX: ABDOMINAL AORTIC ANEURYSM REPAIR: SUR1152

## 2011-07-28 ENCOUNTER — Telehealth: Payer: Self-pay | Admitting: Internal Medicine

## 2011-07-28 NOTE — Telephone Encounter (Signed)
The PFT was done here but the referring doctor was Dr. Shirlee Latch. I do not see PFT scanned into epic though so not sure where the interpreted version is. I faxed a copy to the # given and advised them to contact Dr. Shirlee Latch if they need the other copy. Carron Curie, CMA

## 2011-08-15 ENCOUNTER — Telehealth: Payer: Self-pay | Admitting: Internal Medicine

## 2011-08-15 NOTE — Telephone Encounter (Signed)
Forward 10 pages from [facility] to Dr. Illene Regulus for review on 08-15-11 ym

## 2011-08-25 ENCOUNTER — Telehealth: Payer: Self-pay | Admitting: Internal Medicine

## 2011-08-25 NOTE — Telephone Encounter (Signed)
Received 2 pages. Sent to Dr. Posey Rea. 08/25/11/SD

## 2011-08-30 ENCOUNTER — Other Ambulatory Visit (HOSPITAL_COMMUNITY): Payer: Self-pay | Admitting: Nurse Practitioner

## 2011-10-06 ENCOUNTER — Other Ambulatory Visit: Payer: Self-pay | Admitting: Internal Medicine

## 2011-10-17 ENCOUNTER — Other Ambulatory Visit: Payer: Self-pay | Admitting: Internal Medicine

## 2011-11-11 ENCOUNTER — Encounter: Payer: Self-pay | Admitting: Vascular Surgery

## 2011-11-18 ENCOUNTER — Telehealth: Payer: Self-pay | Admitting: Internal Medicine

## 2011-11-18 NOTE — Telephone Encounter (Signed)
Forward 3 pages from [facility] to Dr. Illene Regulus for review on 11-18-11 ym

## 2011-12-09 ENCOUNTER — Other Ambulatory Visit (INDEPENDENT_AMBULATORY_CARE_PROVIDER_SITE_OTHER): Payer: Medicare Other

## 2011-12-09 ENCOUNTER — Ambulatory Visit (INDEPENDENT_AMBULATORY_CARE_PROVIDER_SITE_OTHER): Payer: Medicare Other | Admitting: Internal Medicine

## 2011-12-09 ENCOUNTER — Encounter: Payer: Self-pay | Admitting: Internal Medicine

## 2011-12-09 VITALS — BP 132/72 | HR 55 | Temp 97.8°F | Resp 12 | Ht 62.0 in | Wt 124.1 lb

## 2011-12-09 DIAGNOSIS — I714 Abdominal aortic aneurysm, without rupture, unspecified: Secondary | ICD-10-CM

## 2011-12-09 DIAGNOSIS — E039 Hypothyroidism, unspecified: Secondary | ICD-10-CM

## 2011-12-09 DIAGNOSIS — N182 Chronic kidney disease, stage 2 (mild): Secondary | ICD-10-CM

## 2011-12-09 DIAGNOSIS — J45909 Unspecified asthma, uncomplicated: Secondary | ICD-10-CM

## 2011-12-09 DIAGNOSIS — N2889 Other specified disorders of kidney and ureter: Secondary | ICD-10-CM

## 2011-12-09 DIAGNOSIS — I10 Essential (primary) hypertension: Secondary | ICD-10-CM

## 2011-12-09 DIAGNOSIS — F17200 Nicotine dependence, unspecified, uncomplicated: Secondary | ICD-10-CM

## 2011-12-09 DIAGNOSIS — Z23 Encounter for immunization: Secondary | ICD-10-CM

## 2011-12-09 DIAGNOSIS — J449 Chronic obstructive pulmonary disease, unspecified: Secondary | ICD-10-CM

## 2011-12-09 DIAGNOSIS — I2510 Atherosclerotic heart disease of native coronary artery without angina pectoris: Secondary | ICD-10-CM

## 2011-12-09 DIAGNOSIS — Z0000 Encounter for general adult medical examination without abnormal findings: Secondary | ICD-10-CM

## 2011-12-09 DIAGNOSIS — E785 Hyperlipidemia, unspecified: Secondary | ICD-10-CM

## 2011-12-09 DIAGNOSIS — M810 Age-related osteoporosis without current pathological fracture: Secondary | ICD-10-CM

## 2011-12-09 LAB — COMPREHENSIVE METABOLIC PANEL
AST: 19 U/L (ref 0–37)
Albumin: 3.7 g/dL (ref 3.5–5.2)
Alkaline Phosphatase: 154 U/L — ABNORMAL HIGH (ref 39–117)
BUN: 36 mg/dL — ABNORMAL HIGH (ref 6–23)
Potassium: 4.8 mEq/L (ref 3.5–5.1)
Sodium: 136 mEq/L (ref 135–145)
Total Protein: 7.1 g/dL (ref 6.0–8.3)

## 2011-12-09 LAB — HEPATIC FUNCTION PANEL
ALT: 16 U/L (ref 0–35)
AST: 19 U/L (ref 0–37)
Alkaline Phosphatase: 154 U/L — ABNORMAL HIGH (ref 39–117)
Total Bilirubin: 0.4 mg/dL (ref 0.3–1.2)

## 2011-12-09 LAB — LIPID PANEL
LDL Cholesterol: 82 mg/dL (ref 0–99)
VLDL: 20.4 mg/dL (ref 0.0–40.0)

## 2011-12-09 NOTE — Patient Instructions (Addendum)
Thank you for coming to see [place]. We're glad that you're doing so well after a big year. Here's a summary of our visit and recommendations:  Home Safety 1. We recommend installing grab bars in the bathroom for fall safety. You might check [employer] or online (e.g., [employer]) for these.  Health Maintenance 1. Congratulations on quitting smoking. This is so important to your health. Keep up the good work! 2. Your last colonoscopy was in 2007. You're not due again until 2017. 3. Mammogram: schedule. Have the [facility] center send [place] the results. 4. Labs today: lipid panel, liver function, kidney function. We will send you the results or you can activate your MyChart account to view. 5. Vaccines today: Influenza, tetanus/tdap  6. Vaccine at next visit: Pneumonia  Skin Condition 1. Given delicate location of eyelid skin, we recommend making an appointment with your dermatologist, Dr. Terri Piedra.

## 2011-12-09 NOTE — Assessment & Plan Note (Signed)
Quit Jan '13 after STEMI

## 2011-12-09 NOTE — Progress Notes (Signed)
Patient ID: Angelica Huynh, female   DOB: Aug 09, 1940, 71 y.o.   MRN: [id number]  HPI Angelica Huynh is a very pleasant 71 year old lady who presents for her annual Medicare wellness examination and management of other chronic and acute problems.  Patient has had two surgeries/procedures in 2013. She had an inferior STEMI in January 2013 with two stents placed to her RCA at that time. She also had an abdominal aortic aneurysm repair in July 2013 that was done with a fenestrated stent in [facility].  Today the patient feels well. She feels well recovered from these surgeries. She has resumed her normal activities of daily living and walks for 30 minutes 3-4 times per week for exercise.  Her only acute concern is dry, itchy skin on her face that she is concerned is eczema. The skin is dry in the outer corners of her nose and across her right eyelid. It has been present for about a month. She has been using cortisone 10 cream that seems to help. It is itchy and raw. Her children had eczema and she is concerned that she is developing it.   She quit smoking in January 2013 and has not smoked since. She had actually started tapering down to quit in October 2012 and was smoking only 1 or 2 cigarettes per day at the time of the STEMI. She has not smoked at all since January 2013. She was congratulated on this achievement.  Of note, the patient does not know why COPD is listed in her diagnoses. She has never had trouble with breathing other than asthma when she was younger. She does not take any medications for asthma or COPD. She notes no problems with her breathing. She climbs stairs and walks for exercise and does not get winded.  The roster of all physicians providing medical care to patient - is listed in the Snapshot section of the chart.  Activities of daily living: The patient is 100% independent in all ADLs: dressing, toileting, feeding as well as independent mobility.  Home safety: The patient has  smoke detectors in the home. They wear seatbelts. No firearms at home. There is no violence in the home.   Fall safety: They have not had any falls. Their home is relatively fall safe. The benefits of grab bars in the bathroom were discussed.  There is no risks for hepatitis, STDs or HIV. There is a distant history of blood transfusion with the birth of her first child. They have no travel history to infectious disease endemic areas of the world.  The patient has not seen their dentist in the last six month. She reports that Angelica Huynh has to take her off Plavix before she has a tooth pulled. They have seen their eye doctor in the last year. They deny any hearing difficulty and have not had audiologic testing in the last year.   They do not have excessive sun exposure. Discussed the need for sun protection: hats, long sleeves and use of sunscreen if there is significant sun exposure.   Diet: the importance of a healthy diet is discussed. They do have a healthy diet, though she feels like she could cut out more salt.  The patient has a regular exercise program: walk for 30 minutes 3-4 times per week.  The benefits of regular aerobic exercise were discussed.  Depression screen: there are no signs or vegative symptoms of depression- irritability, change in appetite, anhedonia, sadness/tearfulness.  Cognitive assessment: the patient manages all  their financial and personal affairs and is actively engaged.  The following portions of the patient's history were reviewed and updated as appropriate: allergies, current medications, past family history, past medical history, past surgical history, past social history and problem list.  Vision, hearing, body mass index were assessed and reviewed.   During the course of the visit the patient was educated and counseled about appropriate screening and preventive services including: fall prevention, diabetes screening, nutrition counseling, colorectal cancer  screening, and recommended immunizations. She had a colonoscopy in 2007 and does not need to repeat until 2017. She is due for a mammogram; she says she needs to schedule this. She has not had a flu vaccine or pneumococcus vaccine. She is not up-to-date on tetanus/tdap vaccine.  Past Medical History  Diagnosis Date  . Pneumonia     Childhood  . Asthma     In remission  . Asthmatic bronchitis   . HTN (hypertension)   . Osteoporosis   . Hyperlipidemia   . Thyroid disease   . AAA (abdominal aortic aneurysm)   . Ocular migraine   . Hypothyroidism   . CAD (coronary artery disease)     A. 01/2011 - Inf STEMI - s/p 2 Promus DES to RCA (2.62mm)  . Tobacco abuse   . MI (myocardial infarction)    Past Surgical History  Procedure Date  . Appendectomy   . Tubal ligation   . Knee arthroscopy   . Orif wrist fracture     LEFT  . Breast surgery     bilateral lumpectomies  . Abdominal aortic aneurysm repair 7/13   Family History  Problem Relation Age of Onset  . Heart attack Mother   . Cervical cancer Mother   . Cancer Mother     cervical Cancer  . Heart attack Father   . Heart disease Father     AAA-rupture, MI  . Heart attack Brother   . Heart disease Brother     AAA rupture  . Breast cancer Maternal Aunt   . Cancer Maternal Aunt     breast   History   Social History  . Marital Status: Widowed    Spouse Name: N/A    Number of Children: 3  . Years of Education: 12   Occupational History  . retired     Corporate treasurer asst   Social History Main Topics  . Smoking status: Former Smoker -- 0.5 packs/day for 25 years    Types: Cigarettes    Quit date: 01/10/2011  . Smokeless tobacco: Never Used  . Alcohol Use: No  . Drug Use: No  . Sexually Active: No   Other Topics Concern  . Not on file   Social History Narrative   HSG. Married - 2 sons, 1 daughter, 5 grandchildren. 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 amLODipine (NORVASC) 10 MG tablet TAKE ONE TABLET BY MOUTH ONE TIME DAILY  90 tablet  0  . aspirin 81 MG EC tablet Take 81 mg by mouth daily.      Marland Kitchen atorvastatin (LIPITOR) 80 MG tablet TAKE ONE TABLET BY MOUTH ONE TIME DAILY  30 tablet  5  . clopidogrel (PLAVIX) 75 MG tablet TAKE 1 TABLET (75 MG TOTAL) BY MOUTH DAILY WITH BREAKFAST.  30 tablet  5  . hydrALAZINE (APRESOLINE)  25 MG tablet Take 1 tablet (25 mg total) by mouth 3 (three) times daily.  90 tablet  6  . ibandronate (BONIVA) 150 MG tablet Take 150 mg by mouth every 30 (thirty) days. Take in the morning with a full glass of water, on an empty stomach, and do not take anything else by mouth or lie down for the next 30 min.       Marland Kitchen levothyroxine (SYNTHROID, LEVOTHROID) 50 MCG tablet TAKE ONE TABLET BY MOUTH ONE TIME DAILY  30 tablet  4  . metoprolol tartrate (LOPRESSOR) 25 MG tablet TAKE ONE HALF TABLET BY MOUTH TWICE DAILY  30 tablet  5  . [DISCONTINUED] atorvastatin (LIPITOR) 80 MG tablet Take 80 mg by mouth daily.      . [DISCONTINUED] levothyroxine (SYNTHROID, LEVOTHROID) 50 MCG tablet TAKE ONE TABLET BY MOUTH ONE TIME DAILY  30 tablet  4  . [DISCONTINUED] metoprolol tartrate (LOPRESSOR) 25 MG tablet Take 12.5 mg by mouth 2 (two) times daily.       PE General: Pleasant lady in NAD HEENT: Pupils round and reactive, ear canals with mild wax build up on right, oral mucosa moist, oropharynx clear. No lymphadenopathy.  CV: Regular rate and rhythm, S1 and S2 present. Pulse regular. No carotid bruits. Pulm: Lungs clear to auscultation bilaterally Abdomen: Soft, nondistended, nontender. Bowel sounds present. Bruit present over abdominal aorta II/VI and R renal artery I-II/VI Derm: Mild dry flaky skin in creases of nares and right upper eyelid Neuro: Alert and appropriate. No focal deficits. Reflexes 2+.  Lab Results   Component Value Date   WBC 7.5 02/15/2011   HGB 11.9* 02/15/2011   HCT 35.0* 02/15/2011   PLT 208 02/15/2011   GLUCOSE 98 12/09/2011   CHOL 153 12/09/2011   TRIG 102.0 12/09/2011   HDL 50.70 12/09/2011   LDLDIRECT 132.4 12/10/2007   LDLCALC 82 12/09/2011        ALT 16 12/09/2011   AST 19 12/09/2011        NA 136 12/09/2011   K 4.8 12/09/2011   CL 108 12/09/2011   CREATININE 2.8* 12/09/2011   BUN 36* 12/09/2011   CO2 19 12/09/2011   TSH 5.88* 12/09/2011     A/P  # Eczema -Given delicate location of eyelid skin, recommend making appointment with dermatologist, Dr. Terri Piedra  # Home Safety -Recommend installing grab bars in the bathroom  # Health Maintenance -Congratulated on smoking cessation -Colonoscopy in 2007; due again in 2017 -Patient will schedule her mammogram - wants to go to [facility] today: Lipid panel, liver, metabolic -Vaccines today: Influenza, tetanus/tdap  -Vaccine at next visit: Pneumonia  Patient seen and examined. I agree with the assessment and plan per Ms. Rochelle Larue, MS III  M.Norins, MD

## 2011-12-10 DIAGNOSIS — N2889 Other specified disorders of kidney and ureter: Secondary | ICD-10-CM | POA: Insufficient documentation

## 2011-12-10 DIAGNOSIS — N182 Chronic kidney disease, stage 2 (mild): Secondary | ICD-10-CM | POA: Insufficient documentation

## 2011-12-10 NOTE — Assessment & Plan Note (Signed)
Interval history remarkable for STEM w/ PTCA/Stent RCA; AAA repair with stent. Physical exam, sans breast and pelvic, normal. Lab results with mild Renal insufficiency and mild anemia noted. She is current for colorectal cancer screening and will schedule her mammogram. Immunizations - Tetanus today and she will return for pneumonia vaccine. She has done well with smoking cessation.   In summary - a very nice woman who has done well with two major medical events. She will need close follow up in regard to renal insufficiency.

## 2011-12-10 NOTE — Assessment & Plan Note (Signed)
Doing well - in remission for many years. Not using any medication

## 2011-12-10 NOTE — Assessment & Plan Note (Signed)
01/28/11 02/15/11  03/13/11  12/09/11 Creatinine   1.56    1.80    1.8    2.8  Developed after AAA stent that was at level of renal arteries. She is followed at [facility] for AAA repair.  Plan Minimize/avoid contrast material exposure  Follow up Bmet/creatinine in 4 - 8 weeks (lab order entered Jan 8 '14 or after)  Hydrate well

## 2011-12-10 NOTE — Assessment & Plan Note (Signed)
BP Readings from Last 3 Encounters:  12/09/11 132/72  04/15/11 140/62  02/15/11 130/52   Adequate control on present medication

## 2011-12-10 NOTE — Assessment & Plan Note (Signed)
LDL is close to goal of 80 or less.  Plan  Prudent low fat diet and continued exercise  Continue present dose of atorvastatin

## 2011-12-10 NOTE — Assessment & Plan Note (Addendum)
DEXA Jan '10: T-scores: -3.0 L-S spine; - 2.8, -3.1 at left and right hips consistent with osteoporosis  Plan continue smoking cessation  Weight bearing exercise - walking  Calcium 1200 mg (diet and supplement) daily  Vitamin D 800-1,000 iu daily  Continue Boniva daily  Follow up DEXA at her convenience

## 2011-12-10 NOTE — Assessment & Plan Note (Signed)
Oct '08 Dec '13 TSH    5.23    5.88  Medication: Levothyroxine 50 mcg  Plan - TSH close to normal range on present regimen. Asymptomatic - will continue present dosing

## 2011-12-10 NOTE — Assessment & Plan Note (Signed)
Stable with no reports of chest pain.  Plan - continued follow up with cardiology

## 2011-12-10 NOTE — Assessment & Plan Note (Signed)
Chart reviewed: imaging of chest suggests mild changes with hyperaeration suggestive of COPD changes, also chronic, stable granulomas

## 2011-12-10 NOTE — Assessment & Plan Note (Signed)
S/p Fenestrated stent Angelica Huynh, Dr. Pattricia Boss, July '13. Had subsequent renal insufficiency. She continues to follow in [place] with contrast CTs  Plan Have suggested she inquire about follow up with MRI for the advantage of no radiation exposure.

## 2011-12-11 NOTE — Progress Notes (Signed)
Copy of office note from the last office visit mailed to Dr. Bennie Pierini, vascular surgeon at [address] [place], [place] [address].

## 2011-12-24 ENCOUNTER — Encounter: Payer: Self-pay | Admitting: Cardiology

## 2011-12-24 ENCOUNTER — Ambulatory Visit (INDEPENDENT_AMBULATORY_CARE_PROVIDER_SITE_OTHER): Payer: Medicare Other | Admitting: Cardiology

## 2011-12-24 VITALS — BP 138/70 | HR 50 | Ht 62.0 in | Wt 125.0 lb

## 2011-12-24 DIAGNOSIS — N182 Chronic kidney disease, stage 2 (mild): Secondary | ICD-10-CM

## 2011-12-24 DIAGNOSIS — I2510 Atherosclerotic heart disease of native coronary artery without angina pectoris: Secondary | ICD-10-CM

## 2011-12-24 DIAGNOSIS — E785 Hyperlipidemia, unspecified: Secondary | ICD-10-CM

## 2011-12-24 DIAGNOSIS — N184 Chronic kidney disease, stage 4 (severe): Secondary | ICD-10-CM

## 2011-12-24 DIAGNOSIS — I714 Abdominal aortic aneurysm, without rupture: Secondary | ICD-10-CM

## 2011-12-24 NOTE — Patient Instructions (Addendum)
You have been referred to [facility] for evaluation of chronic kidney disease.  Your physician wants you to follow-up in: 6 months with Dr Shirlee Latch. (June 2014).You will receive a reminder letter in the mail two months in advance. If you don't receive a letter, please call our office to schedule the follow-up appointment.   Your physician recommends that you have a FASTING lipid profile /BMET in 6 months when you see Dr Shirlee Latch.

## 2011-12-25 ENCOUNTER — Telehealth: Payer: Self-pay | Admitting: Internal Medicine

## 2011-12-25 NOTE — Telephone Encounter (Signed)
Last TSH 5.55 - just a smidgen above normal. May reduce dose to 25 mcg daily (1/2 tab)

## 2011-12-25 NOTE — Progress Notes (Signed)
Patient ID: Angelica Huynh, female   DOB: November 22, 1940, 71 y.o.   MRN: [id number] PCP: Dr. Debby Bud  71 yo with history of COPD, CAD s/p recent inferior MI and AAA presents for cardiology followup. She was admitted in 1/13 with an acute inferior MI.  She had DES x 2 to the RCA.  Since that hospitalization, she has quit smoking.  Echo showed preserved LV systolic function with basal inferior hypokinesis.  In the hospital, I noted that her oxygen saturation was on the low side.  She did not require home oxygen.  She did not appear volume overloaded.  PFTs showed mild COPD responsive to bronchodilator.  In 7/13, she had repair of her AAA with a stent graft at [facility]  This was complicated by contrast nephropathy, with rise in creatinine to 3.33.    Symptomatically, she has been doing well.  No recurrent chest pain.  She does not report significant exertional dyspnea.  She can walk up steps without problem.   ECG: NSR, normal  Labs (10/12): creatinine 1.5, LDL 99, HDL 51 Labs (1/13): K 4.2, creatinine 1.63 Labs (3/13): K 4.7, creatinine 1.8, LDL 45, HDL 40 Labs (12/13): creatinine 2.8, LDL 82, HDL 51  PMH: 1. AAA: 5.5 cm by [place] 11/12, 5.6 cm by CT 11/12.  Repair with stent graft at [facility] in 7/13.  2. Significant aortic atherosclerosis on CT angiogram of abdomen/pelvis 11/12.  3. Asthma/COPD: PFTs with mild obstructive defect responsive to bronchodilator (1/13).  4. HTN 5. Osteoporosis 6. Appendectomy.  7. Migraines 8. Hypothyroidism 9. Hyperlipidemia 10. CKD: Contrast nephropathy at [facility] in 7/13.  11. CAD: Inferior MI 1/13.  Left heart cath showed total occlusion proximal RCA, treated with 2 Promus DES.  Echo (1/13) with EF 55-60%, basal inferior hypokinesis, moderate MR, PA systolic pressure 37 mmHg.  12. Moderate MR on echo 1/13.   SH: Widow, lives in [place]). 3 kids.  Quit smoking in 1/13  FH: Father, half brother, and brother all had AAAs.  Mother with MI at 60, father  with MI in his late 24s, brother with MI.   ROS: All systems reviewed and negative except as per HPI.   Current Outpatient Prescriptions  Medication Sig Dispense Refill  . amLODipine (NORVASC) 10 MG tablet TAKE ONE TABLET BY MOUTH ONE TIME DAILY  90 tablet  0  . aspirin 81 MG EC tablet Take 81 mg by mouth daily.      Marland Kitchen atorvastatin (LIPITOR) 80 MG tablet TAKE ONE TABLET BY MOUTH ONE TIME DAILY  30 tablet  5  . clopidogrel (PLAVIX) 75 MG tablet TAKE 1 TABLET (75 MG TOTAL) BY MOUTH DAILY WITH BREAKFAST.  30 tablet  5  . hydrALAZINE (APRESOLINE) 25 MG tablet Take 1 tablet (25 mg total) by mouth 3 (three) times daily.  90 tablet  6  . ibandronate (BONIVA) 150 MG tablet Take 150 mg by mouth every 30 (thirty) days. Take in the morning with a full glass of water, on an empty stomach, and do not take anything else by mouth or lie down for the next 30 min.       Marland Kitchen levothyroxine (SYNTHROID, LEVOTHROID) 50 MCG tablet TAKE ONE TABLET BY MOUTH ONE TIME DAILY  30 tablet  4  . metoprolol tartrate (LOPRESSOR) 25 MG tablet TAKE ONE HALF TABLET BY MOUTH TWICE DAILY  30 tablet  5    BP 138/70  Pulse 50  Ht 5\' 2"  (1.575 m)  Wt 125  lb (56.7 kg)  BMI 22.86 kg/m2 General: NAD Neck: No JVD, no thyromegaly or thyroid nodule.  Lungs: Clear to auscultation bilaterally with normal respiratory effort. CV: Nondisplaced PMI.  Heart regular S1/S2, no S3/S4, 1/6 HSM at apex.  No peripheral edema.  No carotid bruit.  Normal pedal pulses.  Abdomen: Soft, nontender, no hepatosplenomegaly, no distention.  Neurologic: Alert and oriented x 3.  Psych: Normal affect. Extremities: No clubbing or cyanosis.   Assessment/Plan:  ABDOMINAL AORTIC ANEURYSM Status post AAA stent graft at [facility] in 7/13. CAD (coronary artery disease)  S/p inferior STEMI. EF preserved on echo. She has quit smoking. Continue ASA 81, atorvastatin 80, Plavix 75, metoprolol. She is not on ACEI given CKD.    HYPERLIPIDEMIA Lipids looked ok when checked  this month. Continue atorvastatin 80 mg daily.  HTN  BP has been controlled.  Continue current regimen.  CKD Patient had baseline CKD but it looks like her renal function has worsened since her AAA stent graft.  Had contrast nephropathy at that time.  Creatinine recently was 2.8.  I am going to go ahead and have her establish with nephrology.   Marca Ancona 12/25/2011

## 2011-12-25 NOTE — Telephone Encounter (Signed)
Caller: Angelica Huynh/Patient; Phone: [phone]; Reason for Call: Calling about her thyroid lab results done on 12/3.  She is having hair loss and concerned about her levels.  States that she saw her cardiologist on 12/18 and was told that the hair loss is not from her cardiac meds.

## 2011-12-25 NOTE — Telephone Encounter (Signed)
Pt informed of results and of MD's advisement.

## 2012-01-03 ENCOUNTER — Other Ambulatory Visit: Payer: Self-pay | Admitting: Internal Medicine

## 2012-01-28 ENCOUNTER — Encounter (INDEPENDENT_AMBULATORY_CARE_PROVIDER_SITE_OTHER): Payer: Medicare Other

## 2012-01-28 DIAGNOSIS — N184 Chronic kidney disease, stage 4 (severe): Secondary | ICD-10-CM

## 2012-02-09 ENCOUNTER — Other Ambulatory Visit: Payer: Self-pay | Admitting: *Deleted

## 2012-02-09 DIAGNOSIS — Z01810 Encounter for preprocedural cardiovascular examination: Secondary | ICD-10-CM

## 2012-02-09 DIAGNOSIS — N185 Chronic kidney disease, stage 5: Secondary | ICD-10-CM

## 2012-02-13 ENCOUNTER — Other Ambulatory Visit (HOSPITAL_COMMUNITY): Payer: Self-pay | Admitting: *Deleted

## 2012-02-16 ENCOUNTER — Encounter (HOSPITAL_COMMUNITY)
Admission: RE | Admit: 2012-02-16 | Discharge: 2012-02-16 | Disposition: A | Discharge status: Home / Self Care | Payer: Medicare Other | Source: Ambulatory Visit | Attending: Nephrology | Admitting: Nephrology

## 2012-02-16 DIAGNOSIS — I120 Hypertensive chronic kidney disease with stage 5 chronic kidney disease or end stage renal disease: Secondary | ICD-10-CM | POA: Insufficient documentation

## 2012-02-16 DIAGNOSIS — N185 Chronic kidney disease, stage 5: Secondary | ICD-10-CM | POA: Insufficient documentation

## 2012-02-16 LAB — POCT HEMOGLOBIN-HEMACUE: Hemoglobin: 10 g/dL — ABNORMAL LOW (ref 12.0–15.0)

## 2012-02-16 MED ORDER — DARBEPOETIN ALFA-POLYSORBATE 200 MCG/0.4ML IJ SOLN
INTRAMUSCULAR | Status: AC
  Administered 2012-02-16: 200 ug
  Filled 2012-02-16: qty 0.4

## 2012-02-18 ENCOUNTER — Encounter: Payer: Self-pay | Admitting: Vascular Surgery

## 2012-02-20 ENCOUNTER — Ambulatory Visit (INDEPENDENT_AMBULATORY_CARE_PROVIDER_SITE_OTHER): Payer: Medicare Other | Admitting: Vascular Surgery

## 2012-02-20 ENCOUNTER — Encounter (INDEPENDENT_AMBULATORY_CARE_PROVIDER_SITE_OTHER): Payer: Medicare Other | Admitting: *Deleted

## 2012-02-20 ENCOUNTER — Encounter: Payer: Self-pay | Admitting: Vascular Surgery

## 2012-02-20 VITALS — BP 170/58 | HR 71 | Resp 18 | Ht 61.0 in | Wt 125.0 lb

## 2012-02-20 DIAGNOSIS — Z01810 Encounter for preprocedural cardiovascular examination: Secondary | ICD-10-CM

## 2012-02-20 DIAGNOSIS — N185 Chronic kidney disease, stage 5: Secondary | ICD-10-CM | POA: Insufficient documentation

## 2012-02-20 DIAGNOSIS — N186 End stage renal disease: Secondary | ICD-10-CM

## 2012-02-20 NOTE — Progress Notes (Signed)
[facility] [place]  Referred by:  Michael E Norins, MD [address] , [place] [address]  Reason for referral: New access  History of Present Illness  Angelica Huynh is a 71 y.o. (08/03/1940) female with known juxtarenal AAA s/p fEVAR repair by Dr. Farber who presents for evaluation for permanent access.  Unfortunately, since her procedure her renal function has continued to deteriorate.  The patient is right hand dominant.  The patient has not had previous access procedures.  Previous central venous cannulation procedures include: none.  The patient has never had a PPM placed.   Past Medical History  Diagnosis Date  . Pneumonia     Childhood  . Asthma     In remission  . Asthmatic bronchitis   . HTN (hypertension)   . Osteoporosis   . Hyperlipidemia   . Thyroid disease   . AAA (abdominal aortic aneurysm)   . Ocular migraine   . Hypothyroidism   . CAD (coronary artery disease)     A. 01/2011 - Inf STEMI - s/p 2 Promus DES to RCA (2.5mm)  . Tobacco abuse   . MI (myocardial infarction)     Past Surgical History  Procedure Laterality Date  . Appendectomy    . Tubal ligation    . Knee arthroscopy    . Orif wrist fracture      LEFT  . Breast surgery      bilateral lumpectomies  . Abdominal aortic aneurysm repair  7/13    History   Social History  . Marital Status: Widowed    Spouse Name: N/A    Number of Children: 3  . Years of Education: 12   Occupational History  . retired     admin asst   Social History Main Topics  . Smoking status: Former Smoker -- 0.50 packs/day for 25 years    Types: Cigarettes    Quit date: 01/10/2011  . Smokeless tobacco: Never Used  . Alcohol Use: No  . Drug Use: No  . Sexually Active: No   Other Topics Concern  . Not on file   Social History Narrative   HSG. Married - 2 sons, 1 daughter, 5 grandchildren. Patient continues to work-administrative assistant   Widowed      Regular exercise: 3 - 4  x a week 30 min a day   Caffeine use: 1 cup of coffee daily    Family History  Problem Relation Age of Onset  . Heart attack Mother   . Cervical cancer Mother   . Cancer Mother     cervical Cancer  . Heart attack Father   . Heart disease Father     AAA-rupture, MI  . Heart attack Brother   . Heart disease Brother     AAA rupture  . Breast cancer Maternal Aunt   . Cancer Maternal Aunt     breast    Current Outpatient Prescriptions on File Prior to Visit  Medication Sig Dispense Refill  . amLODipine (NORVASC) 10 MG tablet TAKE ONE TABLET BY MOUTH ONE TIME DAILY  90 tablet  2  . atorvastatin (LIPITOR) 80 MG tablet TAKE ONE TABLET BY MOUTH ONE TIME DAILY  30 tablet  5  . clopidogrel (PLAVIX) 75 MG tablet TAKE 1 TABLET (75 MG TOTAL) BY MOUTH DAILY WITH BREAKFAST.  30 tablet  5  . hydrALAZINE (APRESOLINE) 25 MG tablet Take 1 tablet (25 mg total) by mouth 3 (three) times daily.  90 tablet    6  . ibandronate (BONIVA) 150 MG tablet Take 150 mg by mouth every 30 (thirty) days. Take in the morning with a full glass of water, on an empty stomach, and do not take anything else by mouth or lie down for the next 30 min.       . levothyroxine (SYNTHROID, LEVOTHROID) 50 MCG tablet TAKE ONE TABLET BY MOUTH ONE TIME DAILY  30 tablet  4  . metoprolol tartrate (LOPRESSOR) 25 MG tablet TAKE ONE HALF TABLET BY MOUTH TWICE DAILY  30 tablet  5   No current facility-administered medications on file prior to visit.    Allergies  Allergen Reactions  . Sulfonamide Derivatives Hives    Severe itching    REVIEW OF SYSTEMS:  (Positives checked otherwise negative)  CARDIOVASCULAR:  [ ] chest pain, [ ] chest pressure, [ ] palpitations, [ ] shortness of breath when laying flat, [ ] shortness of breath with exertion,  [ ] pain in feet when walking,  [ ] pain in feet when laying flat, [ ] history of blood clot in veins (DVT), [ ] history of phlebitis, [ ] swelling in legs, [ ] varicose veins  PULMONARY:  [ ]  productive cough, [ ] asthma, [ ] wheezing  NEUROLOGIC:  [ ] weakness in arms or legs, [ ] numbness in arms or legs, [ ] difficulty speaking or slurred speech, [ ] temporary loss of vision in one eye, [ ] dizziness  HEMATOLOGIC:  [ ] bleeding problems, [ ] problems with blood clotting too easily  MUSCULOSKEL:  [ ] joint pain, [ ] joint swelling  GASTROINTEST:  [ ]  Vomiting blood, [ ]  Blood in stool     GENITOURINARY:  [ ]  Burning with urination, [ ]  Blood in urine  PSYCHIATRIC:  [ ] history of major depression  INTEGUMENTARY:  [ ] rashes, [ ] ulcers  CONSTITUTIONAL:  [ ] fever, [ ] chills  Physical Examination  Filed Vitals:   02/20/12 0929  BP: 170/58  Pulse: 71  Resp: 18  Height: 5' 1" (1.549 m)  Weight: 125 lb (56.7 kg)   Body mass index is 23.63 kg/(m^2).  General: A&O x 3, WDWN  Head: [place]/AT  Ear/Nose/Throat: Hearing grossly intact, nares w/o erythema or drainage, oropharynx w/o Erythema/Exudate  Eyes: PERRLA, EOMI  Neck: Supple, no nuchal rigidity, no palpable LAD  Pulmonary: Sym exp, good air movt, CTAB, no rales, rhonchi, & wheezing  Cardiac: RRR, Nl S1, S2, no Murmurs, rubs or gallops  Vascular: Vessel Right Left  Radial Palpable Palpable  Ulnar Palpable Palpable  Brachial Palpable Palpable  Carotid Palpable, without bruit Palpable, without bruit  Aorta Not palpable N/A  Femoral Palpable Palpable  Popliteal Not palpable Not palpable  PT Palpable Palpable  DP Palpable Palpable   Gastrointestinal: soft, NTND, -G/R, - HSM, - masses, - CVAT B  Musculoskeletal: M/S 5/5 throughout , Extremities without ischemic changes   Neurologic: CN 2-12 intact , Pain and light touch intact in extremities , Motor exam as listed above  Psychiatric: Judgment intact, Mood & affect appropriate for pt's clinical situation  Dermatologic: See M/S exam for extremity exam, no rashes otherwise noted  Lymph : No Cervical, Axillary, or Inguinal lymphadenopathy    Non-Invasive Vascular Imaging  Vein Mapping  (Date: 02/20/12):   R arm: acceptable vein conduits include marginal cephalic and basilic vein  L arm: acceptable vein conduits include cephalic and basilic  Medical Decision Making  Angelica   L Huynh is a 71 y.o. female who presents with ESRD requiring hemodialysis.   Based on vein mapping and examination, this patient's permanent access options include: L BC AVF, L staged BVT, R BC AVF, and R stage BVT.  I would start with a L arm fistula, as I suspect some degree of underfilling in the L arm venous system..  On the day of the procedure, I would re-examine the L arm with a tourniquet.  If the vein continue to be marginal, I would move to the right arm.  I had an extensive discussion with this patient in regards to the nature of access surgery, including risk, benefits, and alternatives.    The patient is aware that the risks of access surgery include but are not limited to: bleeding, infection, steal syndrome, nerve damage, ischemic monomelic neuropathy, failure of access to mature, and possible need for additional access procedures in the future.  The patient has agreed to proceed with the above procedure which will be scheduled 25 FEB 14.  Jodiann Ognibene, MD [facility] [place] Office: [phone] Pager: [phone]  02/20/2012, 10:07 AM     

## 2012-02-23 ENCOUNTER — Encounter (HOSPITAL_COMMUNITY): Payer: Self-pay | Admitting: Pharmacy Technician

## 2012-02-23 ENCOUNTER — Other Ambulatory Visit: Payer: Self-pay | Admitting: *Deleted

## 2012-02-23 ENCOUNTER — Encounter: Payer: Self-pay | Admitting: *Deleted

## 2012-02-28 NOTE — Pre-Procedure Instructions (Signed)
CASSI JENNE  02/28/2012   Your procedure is scheduled on:  Tuesday, February 25th  Report to [facility] at 0730 AM.  Call this number if you have problems the morning of surgery: [phone]   Remember:   Do not eat food or drink liquids after midnight.    Take these medicines the morning of surgery with A SIP OF WATER: Norvasc, hydralazine, lopressor, synthroid   Do not wear jewelry, make-up or nail polish.  Do not wear lotions, powders, or perfumes, deodorant.  Do not shave 48 hours prior to surgery.   Do not bring valuables to the hospital.  Contacts, dentures or bridgework may not be worn into surgery.  Leave suitcase in the car. After surgery it may be brought to your room.  For patients admitted to the hospital, checkout time is 11:00 AM the day of discharge.   Patients discharged the day of surgery will not be allowed to drive home.    Special Instructions: Shower using CHG 2 nights before surgery and the night before surgery.  If you shower the day of surgery use CHG.  Use special wash - you have one bottle of CHG for all showers.  You should use approximately 1/3 of the bottle for each shower.   Please read over the following fact sheets that you were given: Pain Booklet, Coughing and Deep Breathing, MRSA Information and Surgical Site Infection Prevention

## 2012-03-01 ENCOUNTER — Encounter (HOSPITAL_COMMUNITY)
Admission: RE | Admit: 2012-03-01 | Discharge: 2012-03-01 | Disposition: A | Discharge status: Home / Self Care | Payer: Medicare Other | Source: Ambulatory Visit | Attending: Vascular Surgery | Admitting: Vascular Surgery

## 2012-03-01 ENCOUNTER — Encounter (HOSPITAL_COMMUNITY): Payer: Self-pay | Admitting: *Deleted

## 2012-03-01 MED ORDER — DEXTROSE 5 % IV SOLN
1.5000 g | INTRAVENOUS | Status: AC
  Administered 2012-03-02: 1.5 g via INTRAVENOUS
  Filled 2012-03-01: qty 1.5

## 2012-03-01 NOTE — Pre-Procedure Instructions (Signed)
Angelica Huynh  03/01/2012   Your procedure is scheduled on:  Tues, Feb 25 @ 10:42 AM  Report to [facility] at 7:30 AM.  Call this number if you have problems the morning of surgery: [phone]   Remember:   Do not eat food or drink liquids after midnight.   Take these medicines the morning of surgery with A SIP OF WATER: Amlodipine(Norvasc),Hydralazine(Apresoline),Synthroid(Levothyroxine),and Metoprolol(Lopressor)   Do not wear jewelry, make-up or nail polish.  Do not wear lotions, powders, or perfumes. You may wear deodorant.  Do not shave 48 hours prior to surgery.  Do not bring valuables to the hospital.  Contacts, dentures or bridgework may not be worn into surgery.  Leave suitcase in the car. After surgery it may be brought to your room.  For patients admitted to the hospital, checkout time is 11:00 AM the day of  discharge.   Patients discharged the day of surgery will not be allowed to drive  home.    Special Instructions: Shower using CHG 2 nights before surgery and the night before surgery.  If you shower the day of surgery use CHG.  Use special wash - you have one bottle of CHG for all showers.  You should use approximately 1/3 of the bottle for each shower.   Please read over the following fact sheets that you were given: Pain Booklet, Coughing and Deep Breathing, MRSA Information and Surgical Site Infection Prevention

## 2012-03-01 NOTE — Progress Notes (Addendum)
Dr.McNeill is cardiologist with [facility] and last visit in Dec 2013--next appointment in June 2014   Stress test done 15+yrs ago  Echo and Heart cath in epic from 01/27/11  EKG in epic from 12/24/11 CXR >1 yr ago

## 2012-03-02 ENCOUNTER — Ambulatory Visit (HOSPITAL_COMMUNITY)
Admission: RE | Admit: 2012-03-02 | Discharge: 2012-03-02 | Disposition: A | Discharge status: Home / Self Care | Payer: Medicare Other | Source: Ambulatory Visit | Attending: Vascular Surgery | Admitting: Vascular Surgery

## 2012-03-02 ENCOUNTER — Encounter (HOSPITAL_COMMUNITY)
Admission: RE | Disposition: A | Discharge status: Home / Self Care | Payer: Self-pay | Source: Ambulatory Visit | Attending: Vascular Surgery

## 2012-03-02 ENCOUNTER — Telehealth: Payer: Self-pay | Admitting: Vascular Surgery

## 2012-03-02 ENCOUNTER — Ambulatory Visit (HOSPITAL_COMMUNITY): Payer: Medicare Other | Admitting: Anesthesiology

## 2012-03-02 ENCOUNTER — Encounter (HOSPITAL_COMMUNITY): Payer: Self-pay | Admitting: Anesthesiology

## 2012-03-02 ENCOUNTER — Ambulatory Visit (HOSPITAL_COMMUNITY): Payer: Medicare Other

## 2012-03-02 ENCOUNTER — Encounter (HOSPITAL_COMMUNITY): Payer: Self-pay

## 2012-03-02 DIAGNOSIS — M810 Age-related osteoporosis without current pathological fracture: Secondary | ICD-10-CM | POA: Insufficient documentation

## 2012-03-02 DIAGNOSIS — I2510 Atherosclerotic heart disease of native coronary artery without angina pectoris: Secondary | ICD-10-CM | POA: Insufficient documentation

## 2012-03-02 DIAGNOSIS — J45909 Unspecified asthma, uncomplicated: Secondary | ICD-10-CM | POA: Insufficient documentation

## 2012-03-02 DIAGNOSIS — F17200 Nicotine dependence, unspecified, uncomplicated: Secondary | ICD-10-CM | POA: Insufficient documentation

## 2012-03-02 DIAGNOSIS — Z7902 Long term (current) use of antithrombotics/antiplatelets: Secondary | ICD-10-CM | POA: Insufficient documentation

## 2012-03-02 DIAGNOSIS — E039 Hypothyroidism, unspecified: Secondary | ICD-10-CM | POA: Insufficient documentation

## 2012-03-02 DIAGNOSIS — Z9861 Coronary angioplasty status: Secondary | ICD-10-CM | POA: Insufficient documentation

## 2012-03-02 DIAGNOSIS — I252 Old myocardial infarction: Secondary | ICD-10-CM | POA: Insufficient documentation

## 2012-03-02 DIAGNOSIS — N185 Chronic kidney disease, stage 5: Secondary | ICD-10-CM | POA: Insufficient documentation

## 2012-03-02 DIAGNOSIS — I739 Peripheral vascular disease, unspecified: Secondary | ICD-10-CM | POA: Insufficient documentation

## 2012-03-02 DIAGNOSIS — Z79899 Other long term (current) drug therapy: Secondary | ICD-10-CM | POA: Insufficient documentation

## 2012-03-02 DIAGNOSIS — E785 Hyperlipidemia, unspecified: Secondary | ICD-10-CM | POA: Insufficient documentation

## 2012-03-02 DIAGNOSIS — N186 End stage renal disease: Secondary | ICD-10-CM

## 2012-03-02 DIAGNOSIS — Z8711 Personal history of peptic ulcer disease: Secondary | ICD-10-CM | POA: Insufficient documentation

## 2012-03-02 DIAGNOSIS — I10 Essential (primary) hypertension: Secondary | ICD-10-CM | POA: Insufficient documentation

## 2012-03-02 HISTORY — DX: Chronic kidney disease, unspecified: N18.9

## 2012-03-02 HISTORY — DX: Anemia, unspecified: D64.9

## 2012-03-02 HISTORY — DX: Localized edema: R60.0

## 2012-03-02 HISTORY — DX: Edema, unspecified: R60.9

## 2012-03-02 HISTORY — DX: Personal history of other medical treatment: Z92.89

## 2012-03-02 HISTORY — DX: Personal history of colon polyps, unspecified: Z86.0100

## 2012-03-02 HISTORY — DX: Personal history of colonic polyps: Z86.010

## 2012-03-02 HISTORY — DX: Personal history of other venous thrombosis and embolism: Z86.718

## 2012-03-02 HISTORY — DX: Gastro-esophageal reflux disease without esophagitis: K21.9

## 2012-03-02 HISTORY — PX: AV FISTULA PLACEMENT: SHX1204

## 2012-03-02 HISTORY — DX: Age-related osteoporosis without current pathological fracture: M81.0

## 2012-03-02 HISTORY — DX: Chronic obstructive pulmonary disease, unspecified: J44.9

## 2012-03-02 HISTORY — DX: Ulcerative colitis, unspecified, without complications: K51.90

## 2012-03-02 LAB — POCT I-STAT 4, (NA,K, GLUC, HGB,HCT)
Glucose, Bld: 96 mg/dL (ref 70–99)
Potassium: 4.6 mEq/L (ref 3.5–5.1)

## 2012-03-02 SURGERY — ARTERIOVENOUS (AV) FISTULA CREATION
Anesthesia: General | Site: Arm Lower | Laterality: Left | Wound class: Clean

## 2012-03-02 MED ORDER — SODIUM CHLORIDE 0.9 % IV SOLN
INTRAVENOUS | Status: DC
  Administered 2012-03-02 (×2): via INTRAVENOUS

## 2012-03-02 MED ORDER — OXYCODONE HCL 5 MG PO TABS
5.0000 mg | ORAL_TABLET | Freq: Once | ORAL | Status: DC | PRN
Start: 2012-03-02 — End: 2012-03-02

## 2012-03-02 MED ORDER — ONDANSETRON HCL 4 MG/2ML IJ SOLN: 4.0000 mg | Freq: Four times a day (QID) | INTRAMUSCULAR | Status: DC | PRN

## 2012-03-02 MED ORDER — FENTANYL CITRATE 0.05 MG/ML IJ SOLN
INTRAMUSCULAR | Status: DC | PRN
  Administered 2012-03-02: 25 ug via INTRAVENOUS

## 2012-03-02 MED ORDER — PROPOFOL 10 MG/ML IV BOLUS
INTRAVENOUS | Status: DC | PRN
  Administered 2012-03-02: 50 mg via INTRAVENOUS
  Administered 2012-03-02: 100 mg via INTRAVENOUS

## 2012-03-02 MED ORDER — LIDOCAINE HCL (CARDIAC) 20 MG/ML IV SOLN
INTRAVENOUS | Status: DC | PRN
  Administered 2012-03-02: 80 mg via INTRAVENOUS

## 2012-03-02 MED ORDER — 0.9 % SODIUM CHLORIDE (POUR BTL) OPTIME
TOPICAL | Status: DC | PRN
  Administered 2012-03-02: 1000 mL

## 2012-03-02 MED ORDER — BUPIVACAINE HCL (PF) 0.5 % IJ SOLN
INTRAMUSCULAR | Status: DC | PRN
  Administered 2012-03-02: 30 mL

## 2012-03-02 MED ORDER — MUPIROCIN 2 % EX OINT
TOPICAL_OINTMENT | Freq: Once | CUTANEOUS | Status: AC
  Administered 2012-03-02: 08:00:00 via NASAL

## 2012-03-02 MED ORDER — OXYCODONE HCL 5 MG/5ML PO SOLN: 5.0000 mg | Freq: Once | ORAL | Status: DC | PRN

## 2012-03-02 MED ORDER — OXYCODONE HCL 5 MG PO TABS: 5.0000 mg | ORAL_TABLET | Freq: Once | ORAL | Status: DC | PRN

## 2012-03-02 MED ORDER — LIDOCAINE-EPINEPHRINE (PF) 1 %-1:200000 IJ SOLN
INTRAMUSCULAR | Status: DC | PRN
  Administered 2012-03-02: 30 mL

## 2012-03-02 MED ORDER — FENTANYL CITRATE 0.05 MG/ML IJ SOLN: 25.0000 ug | INTRAMUSCULAR | Status: DC | PRN

## 2012-03-02 MED ORDER — SODIUM CHLORIDE 0.9 % IR SOLN
Status: DC | PRN
  Administered 2012-03-02: 13:00:00

## 2012-03-02 MED ORDER — BUPIVACAINE HCL (PF) 0.5 % IJ SOLN
INTRAMUSCULAR | Status: AC
  Filled 2012-03-02: qty 30

## 2012-03-02 MED ORDER — ONDANSETRON HCL 4 MG/2ML IJ SOLN
INTRAMUSCULAR | Status: DC | PRN
  Administered 2012-03-02: 4 mg via INTRAVENOUS

## 2012-03-02 MED ORDER — THROMBIN 20000 UNITS EX SOLR
CUTANEOUS | Status: AC
Start: 2012-03-02 — End: 2012-03-02
  Filled 2012-03-02: qty 20000

## 2012-03-02 MED ORDER — MUPIROCIN 2 % EX OINT
TOPICAL_OINTMENT | CUTANEOUS | Status: AC
  Filled 2012-03-02: qty 22

## 2012-03-02 MED ORDER — LIDOCAINE-EPINEPHRINE (PF) 1 %-1:200000 IJ SOLN
INTRAMUSCULAR | Status: AC
  Filled 2012-03-02: qty 10

## 2012-03-02 SURGICAL SUPPLY — 39 items
ADH SKN CLS APL DERMABOND .7 (GAUZE/BANDAGES/DRESSINGS) ×1
ADH SKN CLS LQ APL DERMABOND (GAUZE/BANDAGES/DRESSINGS) ×1
CANISTER SUCTION 2500CC (MISCELLANEOUS) ×2 IMPLANT
CLIP TI MEDIUM 6 (CLIP) ×2 IMPLANT
CLIP TI WIDE RED SMALL 6 (CLIP) ×2 IMPLANT
CLOTH BEACON ORANGE TIMEOUT ST (SAFETY) ×2 IMPLANT
COVER PROBE W GEL 5X96 (DRAPES) ×1 IMPLANT
COVER SURGICAL LIGHT HANDLE (MISCELLANEOUS) ×2 IMPLANT
DECANTER SPIKE VIAL GLASS SM (MISCELLANEOUS) ×1 IMPLANT
DERMABOND ADHESIVE PROPEN (GAUZE/BANDAGES/DRESSINGS) ×1
DERMABOND ADVANCED (GAUZE/BANDAGES/DRESSINGS) ×1
DERMABOND ADVANCED .7 DNX12 (GAUZE/BANDAGES/DRESSINGS) ×1 IMPLANT
DERMABOND ADVANCED .7 DNX6 (GAUZE/BANDAGES/DRESSINGS) IMPLANT
ELECT REM PT RETURN 9FT ADLT (ELECTROSURGICAL) ×2
ELECTRODE REM PT RTRN 9FT ADLT (ELECTROSURGICAL) ×1 IMPLANT
GLOVE BIO SURGEON STRL SZ7 (GLOVE) ×2 IMPLANT
GLOVE BIOGEL PI IND STRL 6.5 (GLOVE) IMPLANT
GLOVE BIOGEL PI IND STRL 7.5 (GLOVE) ×1 IMPLANT
GLOVE BIOGEL PI INDICATOR 6.5 (GLOVE) ×3
GLOVE BIOGEL PI INDICATOR 7.5 (GLOVE) ×2
GLOVE ECLIPSE 6.5 STRL STRAW (GLOVE) ×3 IMPLANT
GLOVE SURG SS PI 6.0 STRL IVOR (GLOVE) ×1 IMPLANT
GLOVE SURG SS PI 7.0 STRL IVOR (GLOVE) ×1 IMPLANT
GOWN STRL NON-REIN LRG LVL3 (GOWN DISPOSABLE) ×4 IMPLANT
KIT BASIN OR (CUSTOM PROCEDURE TRAY) ×2 IMPLANT
KIT ROOM TURNOVER OR (KITS) ×2 IMPLANT
NS IRRIG 1000ML POUR BTL (IV SOLUTION) ×2 IMPLANT
PACK CV ACCESS (CUSTOM PROCEDURE TRAY) ×2 IMPLANT
PAD ARMBOARD 7.5X6 YLW CONV (MISCELLANEOUS) ×4 IMPLANT
SPONGE SURGIFOAM ABS GEL 100 (HEMOSTASIS) IMPLANT
SUT MNCRL AB 4-0 PS2 18 (SUTURE) ×2 IMPLANT
SUT PROLENE 6 0 BV (SUTURE) ×1 IMPLANT
SUT PROLENE 7 0 BV 1 (SUTURE) ×2 IMPLANT
SUT VIC AB 3-0 SH 27 (SUTURE) ×2
SUT VIC AB 3-0 SH 27X BRD (SUTURE) ×1 IMPLANT
TOWEL OR 17X24 6PK STRL BLUE (TOWEL DISPOSABLE) ×2 IMPLANT
TOWEL OR 17X26 10 PK STRL BLUE (TOWEL DISPOSABLE) ×2 IMPLANT
UNDERPAD 30X30 INCONTINENT (UNDERPADS AND DIAPERS) ×2 IMPLANT
WATER STERILE IRR 1000ML POUR (IV SOLUTION) ×2 IMPLANT

## 2012-03-02 NOTE — H&P (View-Only) (Signed)
[facility]   Referred by:  Jacques Navy, MD [address] [place], [place] [address]  Reason for referral: New access  History of Present Illness  Angelica Huynh is a 72 y.o. (10/21/1940) female with known juxtarenal AAA s/p fEVAR repair by Dr. Pattricia Boss who presents for evaluation for permanent access.  Unfortunately, since her procedure her renal function has continued to deteriorate.  The patient is right hand dominant.  The patient has not had previous access procedures.  Previous central venous cannulation procedures include: none.  The patient has never had a PPM placed.   Past Medical History  Diagnosis Date  . Pneumonia     Childhood  . Asthma     In remission  . Asthmatic bronchitis   . HTN (hypertension)   . Osteoporosis   . Hyperlipidemia   . Thyroid disease   . AAA (abdominal aortic aneurysm)   . Ocular migraine   . Hypothyroidism   . CAD (coronary artery disease)     A. 01/2011 - Inf STEMI - s/p 2 Promus DES to RCA (2.64mm)  . Tobacco abuse   . MI (myocardial infarction)     Past Surgical History  Procedure Laterality Date  . Appendectomy    . Tubal ligation    . Knee arthroscopy    . Orif wrist fracture      LEFT  . Breast surgery      bilateral lumpectomies  . Abdominal aortic aneurysm repair  7/13    History   Social History  . Marital Status: Widowed    Spouse Name: N/A    Number of Children: 3  . Years of Education: 12   Occupational History  . retired     Corporate treasurer asst   Social History Main Topics  . Smoking status: Former Smoker -- 0.50 packs/day for 25 years    Types: Cigarettes    Quit date: 01/10/2011  . Smokeless tobacco: Never Used  . Alcohol Use: No  . Drug Use: No  . Sexually Active: No   Other Topics Concern  . Not on file   Social History Narrative   HSG. Married - 2 sons, 1 daughter, 5 grandchildren. Patient continues to Technical brewer   Widowed      Regular exercise: 3 - 4  x a week 30 min a day   Caffeine use: 1 cup of coffee daily    Family History  Problem Relation Age of Onset  . Heart attack Mother   . Cervical cancer Mother   . Cancer Mother     cervical Cancer  . Heart attack Father   . Heart disease Father     AAA-rupture, MI  . Heart attack Brother   . Heart disease Brother     AAA rupture  . Breast cancer Maternal Aunt   . Cancer Maternal Aunt     breast    Current Outpatient Prescriptions on File Prior to Visit  Medication Sig Dispense Refill  . amLODipine (NORVASC) 10 MG tablet TAKE ONE TABLET BY MOUTH ONE TIME DAILY  90 tablet  2  . atorvastatin (LIPITOR) 80 MG tablet TAKE ONE TABLET BY MOUTH ONE TIME DAILY  30 tablet  5  . clopidogrel (PLAVIX) 75 MG tablet TAKE 1 TABLET (75 MG TOTAL) BY MOUTH DAILY WITH BREAKFAST.  30 tablet  5  . hydrALAZINE (APRESOLINE) 25 MG tablet Take 1 tablet (25 mg total) by mouth 3 (three) times daily.  90 tablet  6  . ibandronate (BONIVA) 150 MG tablet Take 150 mg by mouth every 30 (thirty) days. Take in the morning with a full glass of water, on an empty stomach, and do not take anything else by mouth or lie down for the next 30 min.       Marland Kitchen levothyroxine (SYNTHROID, LEVOTHROID) 50 MCG tablet TAKE ONE TABLET BY MOUTH ONE TIME DAILY  30 tablet  4  . metoprolol tartrate (LOPRESSOR) 25 MG tablet TAKE ONE HALF TABLET BY MOUTH TWICE DAILY  30 tablet  5   No current facility-administered medications on file prior to visit.    Allergies  Allergen Reactions  . Sulfonamide Derivatives Hives    Severe itching    REVIEW OF SYSTEMS:  (Positives checked otherwise negative)  CARDIOVASCULAR:  [ ]  chest pain, [ ]  chest pressure, [ ]  palpitations, [ ]  shortness of breath when laying flat, [ ]  shortness of breath with exertion,  [ ]  pain in feet when walking,  [ ]  pain in feet when laying flat, [ ]  history of blood clot in veins (DVT), [ ]  history of phlebitis, [ ]  swelling in legs, [ ]  varicose veins  PULMONARY:  [ ]   productive cough, [ ]  asthma, [ ]  wheezing  NEUROLOGIC:  [ ]  weakness in arms or legs, [ ]  numbness in arms or legs, [ ]  difficulty speaking or slurred speech, [ ]  temporary loss of vision in one eye, [ ]  dizziness  HEMATOLOGIC:  [ ]  bleeding problems, [ ]  problems with blood clotting too easily  MUSCULOSKEL:  [ ]  joint pain, [ ]  joint swelling  GASTROINTEST:  [ ]   Vomiting blood, [ ]   Blood in stool     GENITOURINARY:  [ ]   Burning with urination, [ ]   Blood in urine  PSYCHIATRIC:  [ ]  history of major depression  INTEGUMENTARY:  [ ]  rashes, [ ]  ulcers  CONSTITUTIONAL:  [ ]  fever, [ ]  chills  Physical Examination  Filed Vitals:   02/20/12 0929  BP: 170/58  Pulse: 71  Resp: 18  Height: 5\' 1"  (1.549 m)  Weight: 125 lb (56.7 kg)   Body mass index is 23.63 kg/(m^2).  General: A&O x 3, WDWN  Head: [place]/AT  Ear/Nose/Throat: Hearing grossly intact, nares w/o erythema or drainage, oropharynx w/o Erythema/Exudate  Eyes: PERRLA, EOMI  Neck: Supple, no nuchal rigidity, no palpable LAD  Pulmonary: Sym exp, good air movt, CTAB, no rales, rhonchi, & wheezing  Cardiac: RRR, Nl S1, S2, no Murmurs, rubs or gallops  Vascular: Vessel Right Left  Radial Palpable Palpable  Ulnar Palpable Palpable  Brachial Palpable Palpable  Carotid Palpable, without bruit Palpable, without bruit  Aorta Not palpable N/A  Femoral Palpable Palpable  Popliteal Not palpable Not palpable  PT Palpable Palpable  DP Palpable Palpable   Gastrointestinal: soft, NTND, -G/R, - HSM, - masses, - CVAT B  Musculoskeletal: M/S 5/5 throughout , Extremities without ischemic changes   Neurologic: CN 2-12 intact , Pain and light touch intact in extremities , Motor exam as listed above  Psychiatric: Judgment intact, Mood & affect appropriate for pt's clinical situation  Dermatologic: See M/S exam for extremity exam, no rashes otherwise noted  Lymph : No Cervical, Axillary, or Inguinal lymphadenopathy    Non-Invasive Vascular Imaging  Vein Mapping  (Date: 02/20/12):   R arm: acceptable vein conduits include marginal cephalic and basilic vein  L arm: acceptable vein conduits include cephalic and basilic  Medical Decision Making  Angelica Huynh  L Fayad is a 72 y.o. female who presents with ESRD requiring hemodialysis.   Based on vein mapping and examination, this patient's permanent access options include: L BC AVF, L staged BVT, R BC AVF, and R stage BVT.  I would start with a L arm fistula, as I suspect some degree of underfilling in the L arm venous system..  On the day of the procedure, I would re-examine the L arm with a tourniquet.  If the vein continue to be marginal, I would move to the right arm.  I had an extensive discussion with this patient in regards to the nature of access surgery, including risk, benefits, and alternatives.    The patient is aware that the risks of access surgery include but are not limited to: bleeding, infection, steal syndrome, nerve damage, ischemic monomelic neuropathy, failure of access to mature, and possible need for additional access procedures in the future.  The patient has agreed to proceed with the above procedure which will be scheduled 25 FEB 14.  Leonides Sake, MD [facility] [place] Office: [phone] Pager: [phone]  02/20/2012, 10:07 AM

## 2012-03-02 NOTE — Anesthesia Preprocedure Evaluation (Addendum)
Anesthesia Evaluation  Patient identified by MRN, date of birth, ID band Patient awake    Reviewed: Allergy & Precautions, H&P , NPO status , Patient's Chart, lab work & pertinent test results  Airway Mallampati: II  Neck ROM: full    Dental   Pulmonary asthma , COPDformer smoker,          Cardiovascular hypertension, + CAD, + Past MI and + Peripheral Vascular Disease     Neuro/Psych  Headaches,    GI/Hepatic PUD, GERD-  ,  Endo/Other  Hypothyroidism   Renal/GU ESRFRenal disease     Musculoskeletal   Abdominal   Peds  Hematology   Anesthesia Other Findings   Reproductive/Obstetrics                           Anesthesia Physical Anesthesia Plan  ASA: III  Anesthesia Plan: General   Post-op Pain Management:    Induction: Intravenous  Airway Management Planned: LMA  Additional Equipment:   Intra-op Plan:   Post-operative Plan:   Informed Consent: I have reviewed the patients History and Physical, chart, labs and discussed the procedure including the risks, benefits and alternatives for the proposed anesthesia with the patient or authorized representative who has indicated his/her understanding and acceptance.     Plan Discussed with: CRNA and Surgeon  Anesthesia Plan Comments:         Anesthesia Quick Evaluation

## 2012-03-02 NOTE — Preoperative (Signed)
Beta Blockers   Reason not to administer Beta Blockers:Not Applicable 

## 2012-03-02 NOTE — Interval H&P Note (Signed)
History and Physical Interval Note:  03/02/2012 8:15 AM  Angelica Huynh  has presented today for surgery, with the diagnosis of ESRD  The various methods of treatment have been discussed with the patient and family. After consideration of risks, benefits and other options for treatment, the patient has consented to  Procedure(s): ARTERIOVENOUS (AV) FISTULA CREATION (Left) as a surgical intervention .  The patient's history has been reviewed, patient examined, no change in status, stable for surgery.  I have reviewed the patient's chart and labs.  Questions were answered to the patient's satisfaction.     CHEN,BRIAN LIANG-YU

## 2012-03-02 NOTE — Telephone Encounter (Addendum)
Message copied by Shari Prows on Tue Mar 02, 2012  3:28 PM ------      Message from: Melene Plan      Created: Tue Mar 02, 2012  3:03 PM                   ----- Message -----         From: Fransisco Hertz, MD         Sent: 03/02/2012   1:41 PM           To: Reuel Derby, Melene Plan, RN            Angelica Huynh      [id number]      [birth year]            PROCEDURE:      left brachiocephalic arteriovenous fistula placement            Asst: Lianne Cure, [facility]             Follow-up: 4 weeks       ------  I scheduled an appt on 04/02/12 at 1:15pm w/ [facility]. I mailed an appt letter to the patient and also lm for the pt/awt

## 2012-03-02 NOTE — Transfer of Care (Signed)
Immediate Anesthesia Transfer of Care Note  Patient: Angelica Huynh  Procedure(s) Performed: Procedure(s): ARTERIOVENOUS (AV) FISTULA CREATION (Left)  Patient Location: PACU  Anesthesia Type:General  Level of Consciousness: Awake, alert, oriented  Airway & Oxygen Therapy: Patient Spontanous Breathing and Patient connected to nasal cannula oxygen  Post-op Assessment: Report given to PACU RN, Post -op Vital signs reviewed and stable and Patient moving all extremities X 4  Post vital signs: Reviewed and stable  Complications: No apparent anesthesia complications

## 2012-03-02 NOTE — Op Note (Addendum)
OPERATIVE NOTE   PROCEDURE: left brachiocephalic arteriovenous fistula placement  PRE-OPERATIVE DIAGNOSIS: chronic kidney disease stage IV-V   POST-OPERATIVE DIAGNOSIS: same as above   SURGEON: Leonides Sake, MD  ASSISTANT(S): Lianne Cure, PAC   ANESTHESIA: general  ESTIMATED BLOOD LOSS: 30 cc  FINDING(S): 1. Limited stenosis in cephalic vein distally (dilated with 4 mm dilator) 2. Palpable thrill and dopplerable radial signal at end of case  SPECIMEN(S):  none  INDICATIONS:   Angelica Huynh is a 72 y.o. female who presents with chronic kidney disease stage IV-V .  The patient is scheduled for left brachiocephalic arteriovenous fistula placement.  The patient is aware the risks include but are not limited to: bleeding, infection, steal syndrome, nerve damage, ischemic monomelic neuropathy, failure to mature, and need for additional procedures.  The patient is aware of the risks of the procedure and elects to proceed forward.  DESCRIPTION: After full informed written consent was obtained from the patient, the patient was brought back to the operating room and placed supine upon the operating table.  Prior to induction, the patient received IV antibiotics.   After obtaining adequate anesthesia, the patient was then prepped and draped in the standard fashion for a left arm access procedure.  I turned my attention first to identifying the patient's cephalic vein and brachial artery.  Using SonoSite guidance, the location of these vessels were marked out on the skin.   At this point, I injected local anesthetic to obtain a field block of the antecubitum.  In total, I injected about 5 mL of a 1:1 mixture of 0.5% Marcaine without epinephrine and 1% lidocaine with epinephrine.  I made a longitudinal incision at the level of the antecubitum and dissected through the subcutaneous tissue and fascia to gain exposure of the brachial artery.  This was noted to be 3 mm in diameter externally.  This  was dissected out proximally and distally and controlled with vessel loops .  I then dissected out the cephalic vein.  This was noted to be 3 mm in diameter externally.  The distal segment of the vein was ligated with a  2-0 silk, and the vein was transected.  The proximal segment was iinterrogated with serial dilators.  The vein accepted up to a 4 mm dilator without any difficulty.  I then instilled the heparinized saline into the vein and clamped it.  At this point, I reset my exposure of the brachial artery and placed the artery under tension proximally and distally.  I made an arteriotomy with a #11 blade, and then I extended the arteriotomy with a Potts scissor.  I injected heparinized saline proximal and distal to this arteriotomy.  The vein was then sewn to the artery in an end-to-side configuration with a running stitch of 7-0 Prolene.  Prior to completing this anastomosis, I allowed the vein and artery to backbleed.  There was no evidence of clot from any vessels.  I completed the anastomosis in the usual fashion and then released all vessel loops and clamps.  There was a palpable  thrill in the venous outflow, and there was a dopplerable radial pulse.  At this point, I irrigated out the surgical wound.  There was no further active bleeding.  The subcutaneous tissue was reapproximated with a running stitch of 3-0 Vicryl.  The skin was then reapproximated with a running subcuticular stitch of 4-0 Vicryl.  The skin was then cleaned, dried, and reinforced with Dermabond.  The patient tolerated this procedure well.  COMPLICATIONS: none  CONDITION: stable  Leonides Sake, MD [facility] Office: [phone] Pager: [phone]  03/02/2012, 1:39 PM

## 2012-03-04 ENCOUNTER — Encounter (HOSPITAL_COMMUNITY): Payer: Self-pay | Admitting: Vascular Surgery

## 2012-03-04 NOTE — Anesthesia Postprocedure Evaluation (Signed)
Anesthesia Post Note  Patient: Angelica Huynh  Procedure(s) Performed: Procedure(s) (LRB): ARTERIOVENOUS (AV) FISTULA CREATION (Left)  Anesthesia type: General  Patient location: PACU  Post pain: Pain level controlled and Adequate analgesia  Post assessment: Post-op Vital signs reviewed, Patient's Cardiovascular Status Stable, Respiratory Function Stable, Patent Airway and Pain level controlled  Last Vitals:  Filed Vitals:   03/02/12 1510  BP: 152/67  Pulse: 61  Temp: 36.1 C  Resp: 16    Post vital signs: Reviewed and stable  Level of consciousness: awake, alert  and oriented  Complications: No apparent anesthesia complications

## 2012-03-15 ENCOUNTER — Encounter (HOSPITAL_COMMUNITY)
Admission: RE | Admit: 2012-03-15 | Discharge: 2012-03-15 | Disposition: A | Discharge status: Home / Self Care | Payer: Medicare Other | Source: Ambulatory Visit | Attending: Nephrology | Admitting: Nephrology

## 2012-03-15 ENCOUNTER — Other Ambulatory Visit: Payer: Self-pay | Admitting: Cardiology

## 2012-03-15 DIAGNOSIS — I120 Hypertensive chronic kidney disease with stage 5 chronic kidney disease or end stage renal disease: Secondary | ICD-10-CM | POA: Insufficient documentation

## 2012-03-15 DIAGNOSIS — N185 Chronic kidney disease, stage 5: Secondary | ICD-10-CM | POA: Insufficient documentation

## 2012-03-15 LAB — IRON AND TIBC
Saturation Ratios: 13 % — ABNORMAL LOW (ref 20–55)
TIBC: 318 ug/dL (ref 250–470)
UIBC: 278 ug/dL (ref 125–400)

## 2012-03-15 MED ORDER — DARBEPOETIN ALFA-POLYSORBATE 200 MCG/0.4ML IJ SOLN
INTRAMUSCULAR | Status: AC
  Filled 2012-03-15: qty 0.4

## 2012-03-15 MED ORDER — DARBEPOETIN ALFA-POLYSORBATE 200 MCG/0.4ML IJ SOLN
200.0000 ug | INTRAMUSCULAR | Status: DC
  Administered 2012-03-15: 200 ug via SUBCUTANEOUS

## 2012-03-22 ENCOUNTER — Inpatient Hospital Stay (HOSPITAL_COMMUNITY): Admission: RE | Admit: 2012-03-22 | Payer: Medicare Other | Source: Ambulatory Visit

## 2012-03-30 ENCOUNTER — Other Ambulatory Visit (HOSPITAL_COMMUNITY): Payer: Self-pay | Admitting: *Deleted

## 2012-03-31 ENCOUNTER — Other Ambulatory Visit (HOSPITAL_COMMUNITY): Payer: Self-pay | Admitting: Cardiology

## 2012-03-31 ENCOUNTER — Encounter (HOSPITAL_COMMUNITY): Payer: Medicare Other

## 2012-04-02 ENCOUNTER — Ambulatory Visit: Payer: Medicare Other | Admitting: Vascular Surgery

## 2012-04-06 ENCOUNTER — Encounter (HOSPITAL_COMMUNITY)
Admission: RE | Admit: 2012-04-06 | Discharge: 2012-04-06 | Disposition: A | Discharge status: Home / Self Care | Payer: Medicare Other | Source: Ambulatory Visit | Attending: Nephrology | Admitting: Nephrology

## 2012-04-06 DIAGNOSIS — N185 Chronic kidney disease, stage 5: Secondary | ICD-10-CM | POA: Insufficient documentation

## 2012-04-06 DIAGNOSIS — I120 Hypertensive chronic kidney disease with stage 5 chronic kidney disease or end stage renal disease: Secondary | ICD-10-CM | POA: Insufficient documentation

## 2012-04-06 MED ORDER — SODIUM CHLORIDE 0.9 % IV SOLN
Freq: Once | INTRAVENOUS | Status: AC
  Administered 2012-04-06: 11:00:00 via INTRAVENOUS

## 2012-04-06 MED ORDER — FERUMOXYTOL INJECTION 510 MG/17 ML
INTRAVENOUS | Status: AC
  Administered 2012-04-06: 510 mg via INTRAVENOUS
  Filled 2012-04-06: qty 17

## 2012-04-06 MED ORDER — FERUMOXYTOL INJECTION 510 MG/17 ML: 510.0000 mg | Freq: Once | INTRAVENOUS | Status: AC

## 2012-04-08 ENCOUNTER — Encounter: Payer: Self-pay | Admitting: Vascular Surgery

## 2012-04-09 ENCOUNTER — Ambulatory Visit (INDEPENDENT_AMBULATORY_CARE_PROVIDER_SITE_OTHER): Payer: Medicare Other | Admitting: Vascular Surgery

## 2012-04-09 ENCOUNTER — Encounter: Payer: Self-pay | Admitting: Vascular Surgery

## 2012-04-09 VITALS — BP 179/57 | HR 54 | Ht 62.0 in | Wt 121.6 lb

## 2012-04-09 DIAGNOSIS — N186 End stage renal disease: Secondary | ICD-10-CM | POA: Insufficient documentation

## 2012-04-09 DIAGNOSIS — N185 Chronic kidney disease, stage 5: Secondary | ICD-10-CM

## 2012-04-09 NOTE — Progress Notes (Signed)
[facility] [place]  Postoperative Access Visit  History of Present Illness  Angelica Huynh is a 72 y.o. year old female who presents for postoperative follow-up for: L BC AVF (Date: 03/02/12).  The patient's wounds are healed.  The patient notes no steal symptoms.  The patient is able to complete their activities of daily living.  The patient's current symptoms are: none.  Physical Examination  Filed Vitals:   04/09/12 0909  BP: 179/57  Pulse: 54   LUE: Incision is healed, skin feels warm, hand grip is 5/5, sensation in digits is intact, palpable thrill, bruit can be auscultated , fistula is slightly visible, on Sonosite: > 6 mm throughout  Medical Decision Making  Angelica Huynh is a 72 y.o. year old female who presents s/p L BC AVF.  The patient's access is ready for use.  Thank you for allowing [place] to participate in this patient's care.  Leonides Sake, MD [facility] Office: [phone] Pager: [phone]

## 2012-04-12 ENCOUNTER — Encounter (HOSPITAL_COMMUNITY)
Admission: RE | Admit: 2012-04-12 | Discharge: 2012-04-12 | Disposition: A | Discharge status: Home / Self Care | Payer: Medicare Other | Source: Ambulatory Visit | Attending: Nephrology | Admitting: Nephrology

## 2012-04-12 LAB — IRON AND TIBC
Iron: 129 ug/dL (ref 42–135)
TIBC: 384 ug/dL (ref 250–470)

## 2012-04-12 MED ORDER — DARBEPOETIN ALFA-POLYSORBATE 200 MCG/0.4ML IJ SOLN: 200.0000 ug | INTRAMUSCULAR | Status: DC

## 2012-04-28 ENCOUNTER — Other Ambulatory Visit (HOSPITAL_COMMUNITY): Payer: Self-pay | Admitting: Cardiology

## 2012-04-29 ENCOUNTER — Other Ambulatory Visit: Payer: Self-pay | Admitting: *Deleted

## 2012-04-29 ENCOUNTER — Encounter (HOSPITAL_COMMUNITY): Payer: Medicare Other

## 2012-04-29 MED ORDER — METOPROLOL TARTRATE 25 MG PO TABS: 12.5000 mg | ORAL_TABLET | Freq: Two times a day (BID) | ORAL | Status: DC

## 2012-05-13 ENCOUNTER — Encounter: Payer: Self-pay | Admitting: Cardiology

## 2012-06-07 ENCOUNTER — Other Ambulatory Visit (INDEPENDENT_AMBULATORY_CARE_PROVIDER_SITE_OTHER): Payer: Medicare Other

## 2012-06-07 DIAGNOSIS — I2510 Atherosclerotic heart disease of native coronary artery without angina pectoris: Secondary | ICD-10-CM

## 2012-06-07 DIAGNOSIS — N184 Chronic kidney disease, stage 4 (severe): Secondary | ICD-10-CM

## 2012-06-07 LAB — LIPID PANEL
Cholesterol: 137 mg/dL (ref 0–200)
LDL Cholesterol: 61 mg/dL (ref 0–99)
Triglycerides: 88 mg/dL (ref 0.0–149.0)

## 2012-06-07 LAB — BASIC METABOLIC PANEL
Calcium: 9.3 mg/dL (ref 8.4–10.5)
Chloride: 98 mEq/L (ref 96–112)
Creatinine, Ser: 5.2 mg/dL (ref 0.4–1.2)

## 2012-06-09 ENCOUNTER — Ambulatory Visit (INDEPENDENT_AMBULATORY_CARE_PROVIDER_SITE_OTHER): Payer: Medicare Other | Admitting: Cardiology

## 2012-06-09 ENCOUNTER — Encounter: Payer: Self-pay | Admitting: Cardiology

## 2012-06-09 VITALS — BP 170/60 | HR 58 | Ht 62.0 in | Wt 117.0 lb

## 2012-06-09 DIAGNOSIS — I10 Essential (primary) hypertension: Secondary | ICD-10-CM

## 2012-06-09 DIAGNOSIS — I714 Abdominal aortic aneurysm, without rupture: Secondary | ICD-10-CM

## 2012-06-09 DIAGNOSIS — N186 End stage renal disease: Secondary | ICD-10-CM

## 2012-06-09 DIAGNOSIS — I2510 Atherosclerotic heart disease of native coronary artery without angina pectoris: Secondary | ICD-10-CM

## 2012-06-09 DIAGNOSIS — E785 Hyperlipidemia, unspecified: Secondary | ICD-10-CM

## 2012-06-09 MED ORDER — HYDRALAZINE HCL 50 MG PO TABS: 50.0000 mg | ORAL_TABLET | Freq: Three times a day (TID) | ORAL | Status: DC

## 2012-06-09 NOTE — Patient Instructions (Addendum)
Increase hydralazine to 50mg  three times a day. You can take 2 of your 25mg  tablets three times a day and use your current supply.  Stop Plavix (clopidigrel).   Your physician wants you to follow-up in: 6 months with Dr Shirlee Latch. (December 2014).  You will receive a reminder letter in the mail two months in advance. If you don't receive a letter, please call our office to schedule the follow-up appointment.

## 2012-06-10 NOTE — Progress Notes (Signed)
Patient ID: Angelica Huynh, female   DOB: 27-Jan-1940, 72 y.o.   MRN: [id number] PCP: Dr. Debby Bud  72 yo with history of COPD, CAD s/p inferior MI and AAA presents for cardiology followup. She was admitted in 1/13 with an acute inferior MI.  She had DES x 2 to the RCA.  Since that hospitalization, she has quit smoking.  Echo showed preserved LV systolic function with basal inferior hypokinesis.  In the hospital, I noted that her oxygen saturation was on the low side.  She did not require home oxygen.  She did not appear volume overloaded.  PFTs showed mild COPD responsive to bronchodilator.  In 7/13, she had repair of her AAA with a stent graft at [facility].  This was complicated by AKI on CKD. 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    Symptomatically, she has been doing well.  No recurrent chest pain.  She occasionally gets some dyspnea in between dialysis sessions.  She can walk up steps without problem. BP has been running high.  ECG: NSR, QT prolonged at 494 msec, nonspecific T wave flattening  Labs (10/12): creatinine 1.5, LDL 99, HDL 51 Labs (1/13): K 4.2, creatinine 1.63 Labs (3/13): K 4.7, creatinine 1.8, LDL 45, HDL 40 Labs (12/13): creatinine 2.8, LDL 82, HDL 51 Labs (6/14): K 4, creatinine 5.2, LDL 61, HDl 58  PMH: 1. AAA: 5.5 cm by [place] 11/12, 5.6 cm by CT 11/12.  Repair with stent graft at [facility] in 7/13.  2. Significant aortic atherosclerosis on CT angiogram of abdomen/pelvis 11/12.  3. Asthma/COPD: PFTs with mild obstructive defect responsive to bronchodilator (1/13).  4. HTN 5. Osteoporosis 6. Appendectomy.  7. Migraines 8. Hypothyroidism 9. Hyperlipidemia 10. ESRD 11. CAD: Inferior MI 1/13.  Left heart cath showed total occlusion proximal RCA, treated with 2 Promus DES.  Echo (1/13) with EF 55-60%, basal inferior hypokinesis, moderate MR, PA systolic pressure 37 mmHg.  12. Moderate MR on echo 1/13.   SH: Widow, lives in [place] 3 kids.  Quit smoking in 1/13  FH: Father, half brother, and brother all had AAAs.  Mother with MI at 21, father with MI in his late 87s, brother with MI.   ROS: All systems reviewed and negative except as per HPI.   Current Outpatient Prescriptions  Medication Sig Dispense Refill  . amLODipine (NORVASC) 10 MG tablet Take 10 mg by mouth daily.      Marland Kitchen aspirin 81 MG tablet Take 81 mg by mouth daily.      Marland Kitchen atorvastatin (LIPITOR) 80 MG tablet Take 80 mg by mouth daily.      Marland Kitchen ibandronate (BONIVA) 150 MG tablet Take 150 mg by mouth every 30 (thirty) days. Take in the morning with a full glass of water, on an empty stomach, and do not take anything else by mouth or lie down for the next 30 min.       Marland Kitchen levothyroxine (SYNTHROID, LEVOTHROID) 50 MCG tablet Take 50 mcg by mouth daily.      . metoprolol tartrate (LOPRESSOR) 25 MG tablet Take 0.5 tablets (12.5 mg total) by mouth 2 (two) times daily.  30 tablet  5  . hydrALAZINE (APRESOLINE) 50 MG tablet Take 1 tablet (50 mg total) by mouth 3 (three) times daily.  90 tablet  6   No current facility-administered medications for this visit.    BP 170/60  Pulse 58  Ht 5'  2" (1.575 m)  Wt 117 lb (53.071 kg)  BMI 21.39 kg/m2 General: NAD Neck: No JVD, no thyromegaly or thyroid nodule.  Lungs: Clear to auscultation bilaterally with normal respiratory effort. CV: Nondisplaced PMI.  Heart regular S1/S2, no S3/S4, 2/6 HSM at apex.  No peripheral edema.  No carotid bruit.  Normal pedal pulses.  Abdomen: Soft, nontender, no hepatosplenomegaly, no distention.  Neurologic: Alert and oriented x 3.  Psych: Normal affect. Extremities: No clubbing or cyanosis.   Assessment/Plan:  ABDOMINAL AORTIC ANEURYSM Status post AAA stent graft at [facility] in 7/13.  She will have vascular followup at [facility].  CAD (coronary artery disease)  S/p inferior STEMI. EF preserved on echo. She has quit smoking. Continue ASA 81, atorvastatin 80, metoprolol. She can  stop Plavix.  HYPERLIPIDEMIA Lipids looked ok when checked this month. Continue atorvastatin 80 mg daily.  HTN  BP high.  Increase hydralazine to 50 mg tid.  ESRD Mrs Chason is now on dialysis.  She does this in [place], [place].   Marca Ancona 06/10/2012

## 2012-06-28 ENCOUNTER — Ambulatory Visit: Payer: Medicare Other | Admitting: Cardiology

## 2012-07-02 ENCOUNTER — Other Ambulatory Visit (HOSPITAL_COMMUNITY): Payer: Self-pay | Admitting: Cardiology

## 2012-09-12 IMAGING — CR DG CHEST 2V
2 series · 2 of 2 positions shown · non-contrast
Comparison: 10/21/2010

CLINICAL DATA: MI 3 weeks ago.  Fluid retention in the feet.

CHEST - 2 VIEW

[w chest pa]
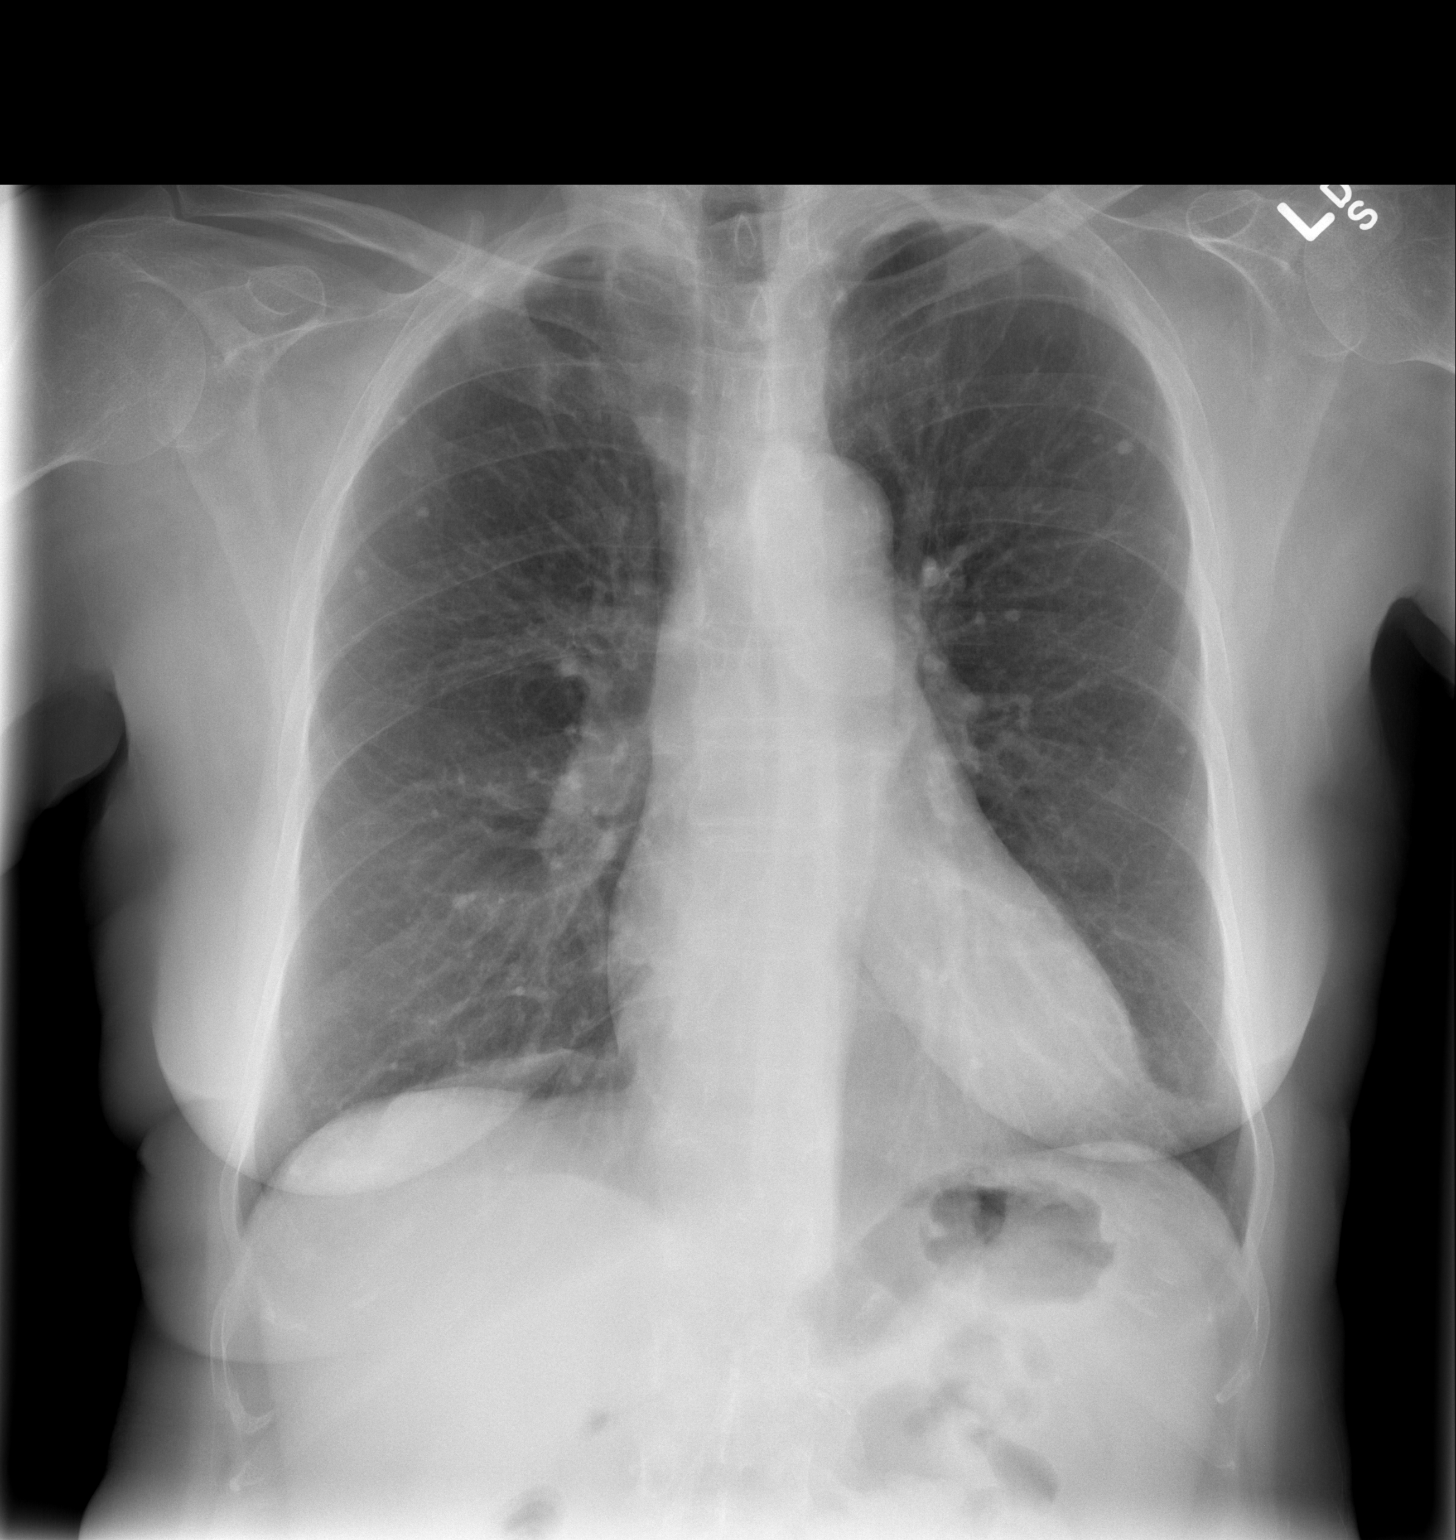

[w chest lat]
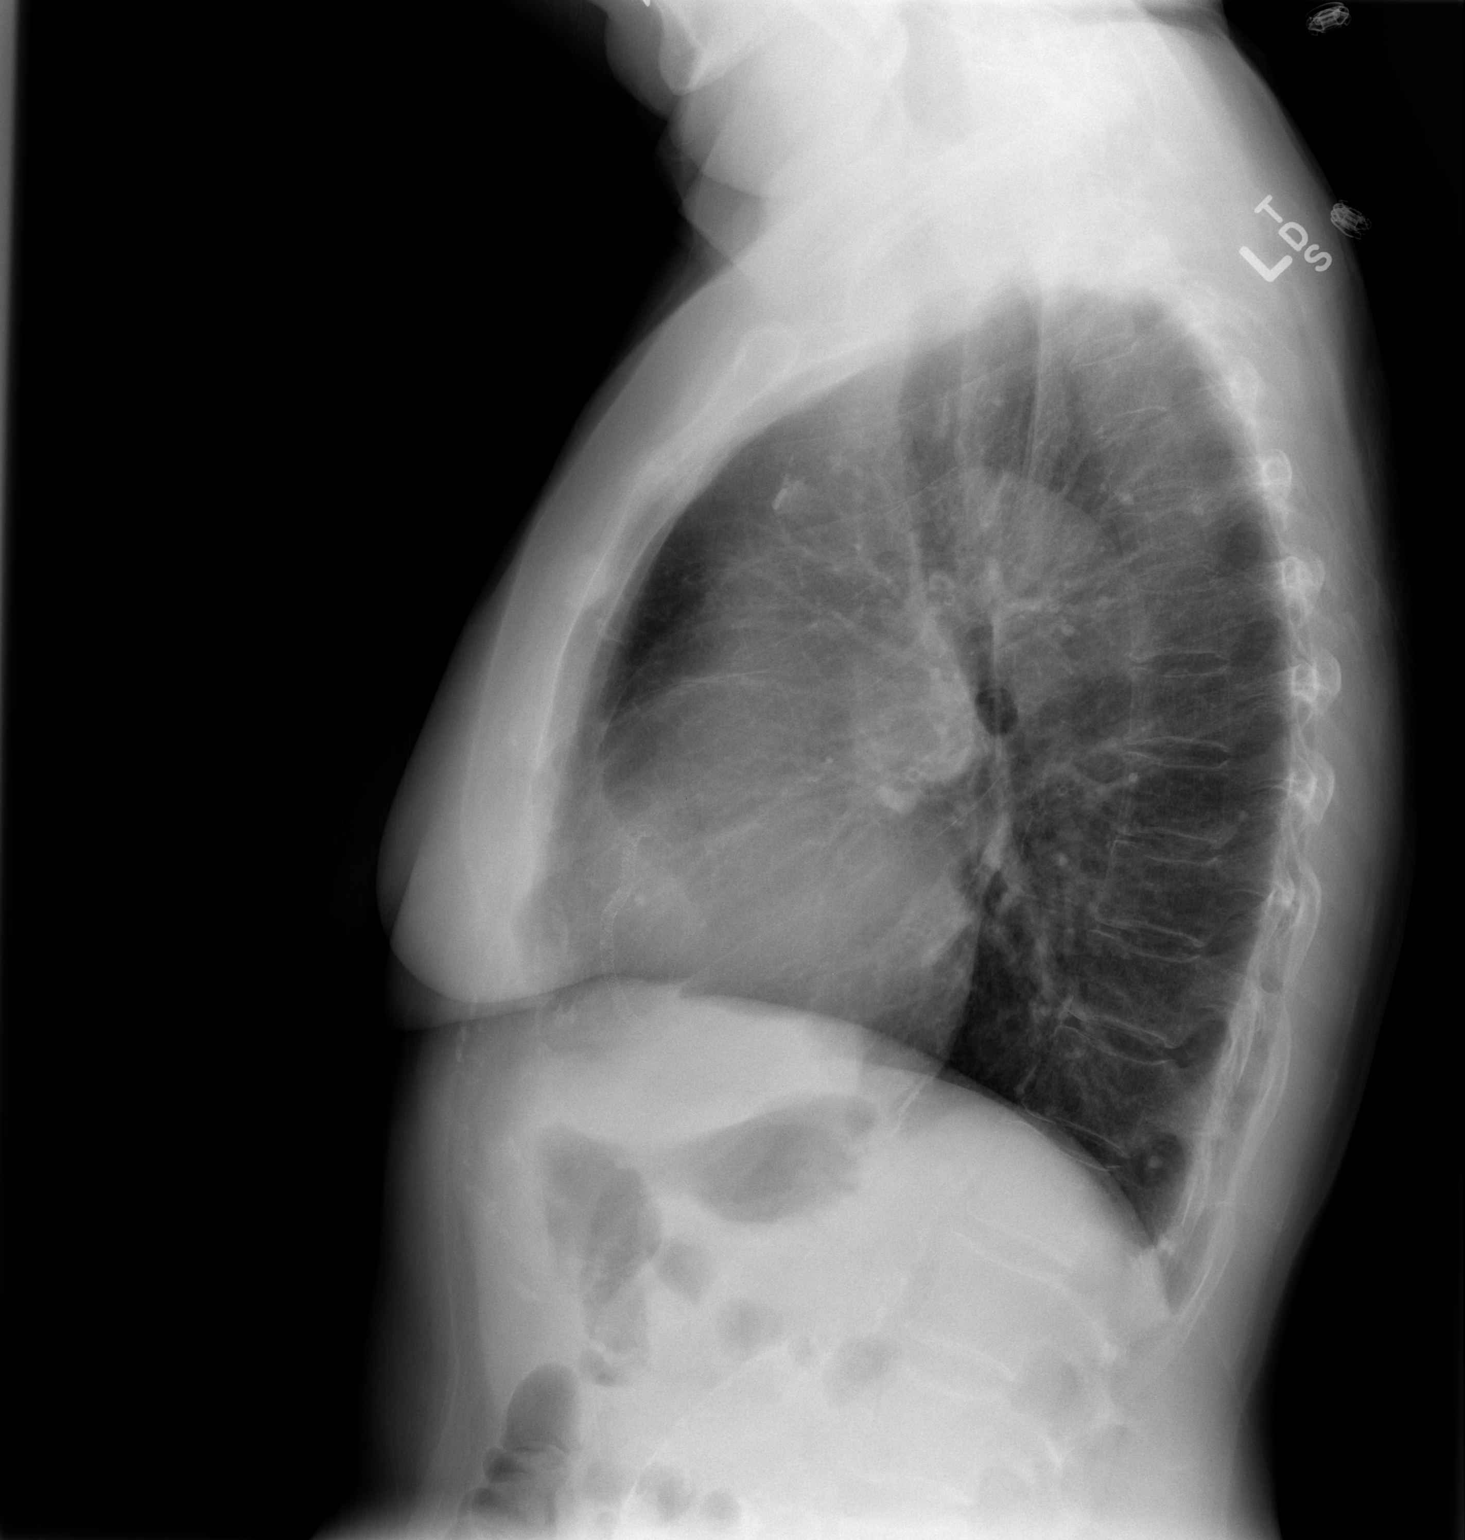

[2 of 2 positions shown; findings below may reference images not displayed]

FINDINGS: Normal heart size and pulmonary vascularity.  Mild
hyperinflation.  Scattered calcified granulomas in the lungs.  No
focal airspace consolidation.  No blunting of costophrenic angles.
No pneumothorax.  No significant change since previous study.
Incidental note of coronary artery stent.
IMPRESSION: No evidence of active pulmonary disease.

## 2012-11-05 ENCOUNTER — Other Ambulatory Visit: Payer: Self-pay | Admitting: Internal Medicine

## 2012-11-21 ENCOUNTER — Emergency Department (HOSPITAL_COMMUNITY): Payer: Medicare Other

## 2012-11-21 ENCOUNTER — Observation Stay (HOSPITAL_COMMUNITY)
Admission: EM | Admit: 2012-11-21 | Discharge: 2012-11-21 | Discharge status: Against Medical Advice | Payer: Medicare Other | Attending: Emergency Medicine | Admitting: Emergency Medicine

## 2012-11-21 DIAGNOSIS — E039 Hypothyroidism, unspecified: Secondary | ICD-10-CM | POA: Insufficient documentation

## 2012-11-21 DIAGNOSIS — I714 Abdominal aortic aneurysm, without rupture, unspecified: Secondary | ICD-10-CM | POA: Insufficient documentation

## 2012-11-21 DIAGNOSIS — J449 Chronic obstructive pulmonary disease, unspecified: Secondary | ICD-10-CM | POA: Insufficient documentation

## 2012-11-21 DIAGNOSIS — Z9889 Other specified postprocedural states: Secondary | ICD-10-CM | POA: Insufficient documentation

## 2012-11-21 DIAGNOSIS — E785 Hyperlipidemia, unspecified: Secondary | ICD-10-CM | POA: Insufficient documentation

## 2012-11-21 DIAGNOSIS — D649 Anemia, unspecified: Secondary | ICD-10-CM | POA: Insufficient documentation

## 2012-11-21 DIAGNOSIS — M810 Age-related osteoporosis without current pathological fracture: Secondary | ICD-10-CM | POA: Insufficient documentation

## 2012-11-21 DIAGNOSIS — E079 Disorder of thyroid, unspecified: Secondary | ICD-10-CM | POA: Insufficient documentation

## 2012-11-21 DIAGNOSIS — I2510 Atherosclerotic heart disease of native coronary artery without angina pectoris: Secondary | ICD-10-CM | POA: Insufficient documentation

## 2012-11-21 DIAGNOSIS — J4489 Other specified chronic obstructive pulmonary disease: Secondary | ICD-10-CM | POA: Insufficient documentation

## 2012-11-21 DIAGNOSIS — Z79899 Other long term (current) drug therapy: Secondary | ICD-10-CM | POA: Insufficient documentation

## 2012-11-21 DIAGNOSIS — Z87891 Personal history of nicotine dependence: Secondary | ICD-10-CM | POA: Insufficient documentation

## 2012-11-21 DIAGNOSIS — I749 Embolism and thrombosis of unspecified artery: Secondary | ICD-10-CM | POA: Insufficient documentation

## 2012-11-21 DIAGNOSIS — I517 Cardiomegaly: Secondary | ICD-10-CM | POA: Insufficient documentation

## 2012-11-21 DIAGNOSIS — I252 Old myocardial infarction: Secondary | ICD-10-CM | POA: Insufficient documentation

## 2012-11-21 DIAGNOSIS — N189 Chronic kidney disease, unspecified: Secondary | ICD-10-CM | POA: Insufficient documentation

## 2012-11-21 DIAGNOSIS — K219 Gastro-esophageal reflux disease without esophagitis: Secondary | ICD-10-CM | POA: Insufficient documentation

## 2012-11-21 DIAGNOSIS — R079 Chest pain, unspecified: Principal | ICD-10-CM | POA: Insufficient documentation

## 2012-11-21 DIAGNOSIS — I129 Hypertensive chronic kidney disease with stage 1 through stage 4 chronic kidney disease, or unspecified chronic kidney disease: Secondary | ICD-10-CM | POA: Insufficient documentation

## 2012-11-21 LAB — CBC
HCT: 33.2 % — ABNORMAL LOW (ref 36.0–46.0)
Hemoglobin: 10.8 g/dL — ABNORMAL LOW (ref 12.0–15.0)
MCH: 30.3 pg (ref 26.0–34.0)
MCHC: 32.5 g/dL (ref 30.0–36.0)
MCV: 93 fL (ref 78.0–100.0)

## 2012-11-21 LAB — POCT I-STAT TROPONIN I: Troponin i, poc: 0.01 ng/mL (ref 0.00–0.08)

## 2012-11-21 LAB — POCT I-STAT, CHEM 8
BUN: 28 mg/dL — ABNORMAL HIGH (ref 6–23)
Calcium, Ion: 1.1 mmol/L — ABNORMAL LOW (ref 1.13–1.30)
Chloride: 99 mEq/L (ref 96–112)
Creatinine, Ser: 4.9 mg/dL — ABNORMAL HIGH (ref 0.50–1.10)
Glucose, Bld: 105 mg/dL — ABNORMAL HIGH (ref 70–99)
Hemoglobin: 11.6 g/dL — ABNORMAL LOW (ref 12.0–15.0)

## 2012-11-21 MED ORDER — AMLODIPINE BESYLATE 10 MG PO TABS: 10.0000 mg | ORAL_TABLET | Freq: Every day | ORAL | Status: DC

## 2012-11-21 MED ORDER — SODIUM CHLORIDE 0.9 % IJ SOLN: 3.0000 mL | Freq: Two times a day (BID) | INTRAMUSCULAR | Status: DC

## 2012-11-21 MED ORDER — ACETAMINOPHEN 325 MG PO TABS: 650.0000 mg | ORAL_TABLET | Freq: Four times a day (QID) | ORAL | Status: DC | PRN

## 2012-11-21 MED ORDER — SODIUM CHLORIDE 0.9 % IV SOLN: INTRAVENOUS | Status: DC

## 2012-11-21 MED ORDER — ALUM & MAG HYDROXIDE-SIMETH 200-200-20 MG/5ML PO SUSP: 30.0000 mL | Freq: Four times a day (QID) | ORAL | Status: DC | PRN

## 2012-11-21 MED ORDER — ONDANSETRON HCL 4 MG/2ML IJ SOLN: 4.0000 mg | Freq: Four times a day (QID) | INTRAMUSCULAR | Status: DC | PRN

## 2012-11-21 MED ORDER — LEVOTHYROXINE SODIUM 50 MCG PO TABS: 50.0000 ug | ORAL_TABLET | Freq: Every day | ORAL | Status: DC

## 2012-11-21 MED ORDER — ATORVASTATIN CALCIUM 80 MG PO TABS: 80.0000 mg | ORAL_TABLET | Freq: Every day | ORAL | Status: DC

## 2012-11-21 MED ORDER — CALCIUM ACETATE 667 MG PO CAPS: 1334.0000 mg | ORAL_CAPSULE | Freq: Three times a day (TID) | ORAL | Status: DC

## 2012-11-21 MED ORDER — ONDANSETRON HCL 4 MG PO TABS: 4.0000 mg | ORAL_TABLET | Freq: Four times a day (QID) | ORAL | Status: DC | PRN

## 2012-11-21 MED ORDER — ASPIRIN EC 325 MG PO TBEC: 325.0000 mg | DELAYED_RELEASE_TABLET | Freq: Every day | ORAL | Status: DC

## 2012-11-21 MED ORDER — ACETAMINOPHEN 650 MG RE SUPP: 650.0000 mg | Freq: Four times a day (QID) | RECTAL | Status: DC | PRN

## 2012-11-21 MED ORDER — HYDRALAZINE HCL 50 MG PO TABS: 50.0000 mg | ORAL_TABLET | Freq: Three times a day (TID) | ORAL | Status: DC

## 2012-11-21 MED ORDER — NITROGLYCERIN 2 % TD OINT: 1.0000 [in_us] | TOPICAL_OINTMENT | Freq: Four times a day (QID) | TRANSDERMAL | Status: DC

## 2012-11-21 MED ORDER — NITROGLYCERIN 0.4 MG SL SUBL: 0.4000 mg | SUBLINGUAL_TABLET | SUBLINGUAL | Status: DC | PRN

## 2012-11-21 MED ORDER — ZOLPIDEM TARTRATE 5 MG PO TABS: 5.0000 mg | ORAL_TABLET | Freq: Every evening | ORAL | Status: DC | PRN

## 2012-11-21 MED ORDER — HYDROMORPHONE HCL PF 1 MG/ML IJ SOLN: 0.5000 mg | INTRAMUSCULAR | Status: DC | PRN

## 2012-11-21 MED ORDER — OXYCODONE HCL 5 MG PO TABS: 5.0000 mg | ORAL_TABLET | ORAL | Status: DC | PRN

## 2012-11-21 MED ORDER — ENOXAPARIN SODIUM 40 MG/0.4ML ~~LOC~~ SOLN: 40.0000 mg | SUBCUTANEOUS | Status: DC

## 2012-11-21 MED ORDER — CINACALCET HCL 30 MG PO TABS: 30.0000 mg | ORAL_TABLET | Freq: Every day | ORAL | Status: DC

## 2012-11-21 MED ORDER — METOPROLOL TARTRATE 25 MG PO TABS: 12.5000 mg | ORAL_TABLET | Freq: Two times a day (BID) | ORAL | Status: DC

## 2012-11-21 NOTE — ED Notes (Signed)
Reports pain now just minimal.  Friends at bedside visiting with patient.

## 2012-11-21 NOTE — ED Notes (Signed)
Old and new EKG given to Dr Opitz. 

## 2012-11-21 NOTE — ED Notes (Signed)
Report to floor RN.  Pt is not in  Agreement with staying in the hospital.  Spoke to ED Physician, asked him to go see patient to explain why she is staying.

## 2012-11-21 NOTE — ED Notes (Addendum)
Chest pain sudden onset mid chest, nonradiating.  Took antacid without relief.  Also took aspirin 324mg , called 911. By the time EMS arrived, pain was easing.  EMS sprayed NTG in pt mouth, pain relieved.

## 2012-11-21 NOTE — ED Notes (Signed)
Dr Dierdre Highman spoke with pt regarding admission.  Pt refuses to be admitted.  Signed out AMA.

## 2012-11-21 NOTE — Consult Note (Signed)
Pt decided to leave AMA and could not be evaluated in the [facility] her to be followed by her cardiologist asap

## 2012-11-21 NOTE — ED Provider Notes (Addendum)
CSN: [id number]     Arrival date & time 11/21/12  0041 History   First MD Initiated Contact with Patient 11/21/12 0126     Chief Complaint  Patient presents with  . Chest Pain   (Consider location/radiation/quality/duration/timing/severity/associated sxs/prior Treatment) HPI Hx per PT _ followed by [facility], has h/o CAD and MI last year.  At home tonight, at rest, developed CP located across her chest, radiated to back, lasted about 2 hours, resolved with NTG and ASA in route. Pain was heavieness, same as previous MI. No N/V, no SOB, no diaphoresis, no leg pain or swelling. Now pain free in the ED. Pain was 7/10.  CKD - had routine dialysis today  Past Medical History  Diagnosis Date  . Osteoporosis   . Hyperlipidemia     takes Lipitor daily  . Thyroid disease   . AAA (abdominal aortic aneurysm)   . CAD (coronary artery disease)     A. 01/2011 - Inf STEMI - s/p 2 Promus DES to RCA (2.11mm)  . Tobacco abuse   . HTN (hypertension)     takes Metoprolol,Amlodipine,and Hydralazine daily  . MI (myocardial infarction) 01/2011  . History of blood clots     takes Plavix(stopped on the 19th) and Aspirin daily  . COPD (chronic obstructive pulmonary disease)   . Asthma     pt states when she was younger  . Asthmatic bronchitis   . Pneumonia     as a child and once as an adult  . Peripheral edema   . GERD (gastroesophageal reflux disease)     may take OTC meds if needed  . History of colon polyps   . Ulcerative colitis   . Osteoporosis     takes Boniva monthly  . Anemia     Aranesp monthly  . Chronic kidney disease     not on dialysis yet  . History of blood transfusion     doesn't recall any abnormal reaction to the blood  . Hypothyroidism     takes Synthroid daily   Past Surgical History  Procedure Laterality Date  . Appendectomy    . Tubal ligation    . Knee arthroscopy    . Orif wrist fracture      LEFT  . Breast surgery      bilateral lumpectomies  .  Abdominal aortic aneurysm repair  7/13  . Tonsillectomy    . Coronary angioplasty  01/27/11    2 stents  . Colonoscopy    . Esophagogastroduodenoscopy  25+ yrs ago  . Vascular surgery  2013    AAA repair- done at [facility]  . Av fistula placement Left 03/02/2012    Procedure: ARTERIOVENOUS (AV) FISTULA CREATION;  Surgeon: Fransisco Hertz, MD;  Location: [facility] OR;  Service: Vascular;  Laterality: Left;   Family History  Problem Relation Age of Onset  . Heart attack Mother   . Cervical cancer Mother   . Cancer Mother     cervical Cancer  . Heart attack Father   . Heart disease Father     AAA-rupture, MI  . Heart attack Brother   . Heart disease Brother     AAA rupture  . Breast cancer Maternal Aunt   . Cancer Maternal Aunt     breast   History  Substance Use Topics  . Smoking status: Former Smoker -- 0.50 packs/day for 25 years    Types: Cigarettes    Quit date: 01/10/2011  . Smokeless tobacco: Never  Used  . Alcohol Use: No   OB History   Grav Para Term Preterm Abortions TAB SAB Ect Mult Living                 Review of Systems  Constitutional: Negative for fever and chills.  Eyes: Negative for visual disturbance.  Respiratory: Negative for shortness of breath.   Cardiovascular: Positive for chest pain.  Gastrointestinal: Negative for abdominal pain.  Genitourinary: Negative for dysuria.  Musculoskeletal: Positive for back pain. Negative for neck pain and neck stiffness.  Skin: Negative for rash.  Neurological: Negative for headaches.  All other systems reviewed and are negative.    Allergies  Sulfonamide derivatives  Home Medications   Current Outpatient Rx  Name  Route  Sig  Dispense  Refill  . amLODipine (NORVASC) 10 MG tablet   Oral   Take 10 mg by mouth daily.         Marland Kitchen aspirin 81 MG tablet   Oral   Take 81 mg by mouth daily.         Marland Kitchen atorvastatin (LIPITOR) 80 MG tablet   Oral   Take 80 mg by mouth daily.         . calcium acetate (PHOSLO)  667 MG capsule   Oral   Take 1,334 mg by mouth 3 (three) times daily with meals.         . cinacalcet (SENSIPAR) 30 MG tablet   Oral   Take 30 mg by mouth daily.         . hydrALAZINE (APRESOLINE) 50 MG tablet   Oral   Take 1 tablet (50 mg total) by mouth 3 (three) times daily.   90 tablet   6   . levothyroxine (SYNTHROID, LEVOTHROID) 50 MCG tablet   Oral   Take 50 mcg by mouth daily.         . metoprolol tartrate (LOPRESSOR) 25 MG tablet   Oral   Take 0.5 tablets (12.5 mg total) by mouth 2 (two) times daily.   30 tablet   5    BP 171/53  Temp(Src) 98.3 F (36.8 C) (Oral)  Resp 16  SpO2 95% Physical Exam  Constitutional: She is oriented to person, place, and time. She appears well-developed and well-nourished.  HENT:  Head: Normocephalic and atraumatic.  Eyes: EOM are normal. Pupils are equal, round, and reactive to light.  Neck: Neck supple.  Cardiovascular: Normal rate, regular rhythm and intact distal pulses.   Pulmonary/Chest: Effort normal and breath sounds normal. No respiratory distress. She exhibits no tenderness.  Abdominal: Soft. Bowel sounds are normal. She exhibits no distension. There is no tenderness.  Musculoskeletal: Normal range of motion. She exhibits no edema.  Neurological: She is alert and oriented to person, place, and time. No cranial nerve deficit.  Skin: Skin is warm and dry. No rash noted.    ED Course  Procedures (including critical care time) Labs Review Labs Reviewed  CBC - Abnormal; Notable for the following:    RBC 3.57 (*)    Hemoglobin 10.8 (*)    HCT 33.2 (*)    Platelets 141 (*)    All other components within normal limits  POCT I-STAT, CHEM 8 - Abnormal; Notable for the following:    BUN 28 (*)    Creatinine, Ser 4.90 (*)    Glucose, Bld 105 (*)    Calcium, Ion 1.10 (*)    Hemoglobin 11.6 (*)    HCT 34.0 (*)  All other components within normal limits  POCT I-STAT TROPONIN I   Imaging Review Dg Chest Portable  1 View  11/21/2012   CLINICAL DATA:  Chest pain, history of abdominal aortic aneurysm, hypertension, COPD.  EXAM: PORTABLE CHEST - 1 VIEW  COMPARISON:  Chest radiograph November 30, 2012  FINDINGS: Cardiac silhouette remains moderately enlarged, mildly, mediastinal silhouette is nonsuspicious. Scattered tiny granuloma again noted, low with mild chronic interstitial changes, no superimposed pleural effusions or focal consolidations. Similar pulmonary hyper expansion. Mild apical pleural thickening. Pulmonary vasculature is unremarkable. No pneumothorax.  Tunneled dialysis catheter via right internal jugular venous approach with distal tip projecting at cavoatrial junction. Multiple EKG lines overlie the patient and may obscure subtle underlying pathology. Partially imaged abdominal aortic endovascular stent. Soft tissue planes and included osseous structures are nonsuspicious.  IMPRESSION: Similar appearance of chest: Cardiomegaly, emphysematous changes, granulomata without superimposed acute cardiopulmonary process.   Electronically Signed   By: Awilda Metro   On: 11/21/2012 02:36     Date: 11/21/2012  Rate: 58  Rhythm: sinus bradycardia  QRS Axis: normal  Intervals: normal  ST/T Wave abnormalities: nonspecific ST changes  Conduction Disutrbances:none  Narrative Interpretation:   Old EKG Reviewed: changes noted prior ECG sinus brady, now has PVCs and mild lat ST depressions  ASA and NTG PTA Pain free inER  3:02 AM d/w CAR Dr Jomarie Longs, plan MED admit  MDM  Dx: CP h/o CAD  ECG, CXR, labs MED admit  Sunnie Nielsen, MD 11/21/12 0401   4:46 AM patient now prefers to go home. I had a long talk with her regarding my concern that her pain may have represented a heart attack related to coronary artery disease. Her family is bedside. Patient states understanding my concern. She states understanding risk of leaving AMA including death or serious disability. She is alert and oriented x4 and despite  risks, wishes to leave AMA  Sunnie Nielsen, MD 11/21/12 [phone]

## 2012-11-22 ENCOUNTER — Telehealth: Payer: Self-pay | Admitting: Cardiology

## 2012-11-22 NOTE — Telephone Encounter (Signed)
New Problem:  Pt is requesting sooner appt w/ Shirlee Latch. Pt states she does not want to see the PA or NP.. I gave pt the next available appt 1/7. Pt is requesting to be worked in. Please advise

## 2012-11-23 NOTE — Telephone Encounter (Signed)
Dr Shirlee Latch  will see patient today at 1:30PM. [facility] for pt to let her know about appointment.

## 2012-11-23 NOTE — Telephone Encounter (Signed)
Answering machine. 

## 2012-11-25 NOTE — Telephone Encounter (Signed)
LMTCB

## 2012-11-30 NOTE — Telephone Encounter (Signed)
I offered to arrange sooner appt for pt with Dr Shirlee Latch. Pt has decided that she does not want sooner appt.  She has not had any chest pain since ED visit and she has seen her nephrologist since then. She will call back if she changes her mind or if her symptoms change.

## 2012-12-07 ENCOUNTER — Telehealth: Payer: Self-pay

## 2012-12-07 NOTE — Telephone Encounter (Signed)
Phone call to patient and left a message letting her know she is due for Prevnar vaccine. 

## 2013-01-10 ENCOUNTER — Other Ambulatory Visit (HOSPITAL_COMMUNITY): Payer: Self-pay | Admitting: Cardiology

## 2013-01-12 ENCOUNTER — Encounter: Payer: Self-pay | Admitting: Cardiology

## 2013-01-12 ENCOUNTER — Encounter: Payer: Self-pay | Admitting: *Deleted

## 2013-01-12 ENCOUNTER — Ambulatory Visit (INDEPENDENT_AMBULATORY_CARE_PROVIDER_SITE_OTHER): Payer: Medicare Other | Admitting: Cardiology

## 2013-01-12 ENCOUNTER — Encounter (INDEPENDENT_AMBULATORY_CARE_PROVIDER_SITE_OTHER): Payer: Self-pay

## 2013-01-12 VITALS — BP 140/64 | HR 56 | Ht 62.0 in | Wt 116.0 lb

## 2013-01-12 DIAGNOSIS — I714 Abdominal aortic aneurysm, without rupture, unspecified: Secondary | ICD-10-CM

## 2013-01-12 DIAGNOSIS — R0989 Other specified symptoms and signs involving the circulatory and respiratory systems: Secondary | ICD-10-CM

## 2013-01-12 DIAGNOSIS — E785 Hyperlipidemia, unspecified: Secondary | ICD-10-CM

## 2013-01-12 DIAGNOSIS — I2510 Atherosclerotic heart disease of native coronary artery without angina pectoris: Secondary | ICD-10-CM

## 2013-01-12 DIAGNOSIS — I10 Essential (primary) hypertension: Secondary | ICD-10-CM

## 2013-01-12 DIAGNOSIS — N186 End stage renal disease: Secondary | ICD-10-CM

## 2013-01-12 LAB — LIPID PANEL
CHOLESTEROL: 125 mg/dL (ref 0–200)
HDL: 66.4 mg/dL (ref >39.00)
LDL Cholesterol: 47 mg/dL (ref 0–99)
Total CHOL/HDL Ratio: 2
Triglycerides: 56 mg/dL (ref 0.0–149.0)
VLDL: 11.2 mg/dL (ref 0.0–40.0)

## 2013-01-12 MED ORDER — NITROGLYCERIN 0.4 MG SL SUBL: 0.4000 mg | SUBLINGUAL_TABLET | SUBLINGUAL | Status: AC | PRN

## 2013-01-12 MED ORDER — LEVOTHYROXINE SODIUM 50 MCG PO TABS: 50.0000 ug | ORAL_TABLET | Freq: Every day | ORAL | Status: AC

## 2013-01-12 NOTE — Progress Notes (Signed)
Patient ID: Angelica Huynh, female   DOB: 1940/04/12, 73 y.o.   MRN: [id number] PCP: Dr. Debby Bud  73 yo with history of COPD, CAD s/p inferior MI and AAA presents for cardiology followup. She was admitted in 1/13 with an acute inferior MI.  She had DES x 2 to the RCA.  Since that hospitalization, she has quit smoking.  Echo showed preserved LV systolic function with basal inferior hypokinesis.  In the hospital, I noted that her oxygen saturation was on the low side.  She did not require home oxygen.  She did not appear volume overloaded.  PFTs showed mild COPD responsive to bronchodilator.  In 7/13, she had repair of her AAA with a stent graft at [facility].  This was complicated by AKI on CKD. 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    Symptomatically, she has been doing well.  She had 1 episode of chest pain in 11/14. She went to the ER and workup was negative.  She has not had chest pain since that time. No significant dyspnea. She can walk up steps without problem. BP acceptable.  Labs (10/12): creatinine 1.5, LDL 99, HDL 51 Labs (1/13): K 4.2, creatinine 1.63 Labs (3/13): K 4.7, creatinine 1.8, LDL 45, HDL 40 Labs (12/13): creatinine 2.8, LDL 82, HDL 51 Labs (6/14): K 4, creatinine 5.2, LDL 61, HDl 58  PMH: 1. AAA: 5.5 cm by [place] 11/12, 5.6 cm by CT 11/12.  Repair with stent graft at [facility] in 7/13.  2. Significant aortic atherosclerosis on CT angiogram of abdomen/pelvis 11/12.  3. Asthma/COPD: PFTs with mild obstructive defect responsive to bronchodilator (1/13).  4. HTN 5. Osteoporosis 6. Appendectomy.  7. Migraines 8. Hypothyroidism 9. Hyperlipidemia 10. ESRD 11. CAD: Inferior MI 1/13.  Left heart cath showed total occlusion proximal RCA, treated with 2 Promus DES.  Echo (1/13) with EF 55-60%, basal inferior hypokinesis, moderate MR, PA systolic pressure 37 mmHg.  12. Moderate MR on echo 1/13.   SH: Widow, lives in [place]). 3 kids.  Quit smoking in 1/13  FH: Father, half brother, and brother all had AAAs.  Mother with MI at 28, father with MI in his late 44s, brother with MI.   ROS: All systems reviewed and negative except as per HPI.   Current Outpatient Prescriptions  Medication Sig Dispense Refill  . amLODipine (NORVASC) 10 MG tablet Take 10 mg by mouth daily.      Marland Kitchen aspirin 81 MG tablet Take 81 mg by mouth daily.      Marland Kitchen atorvastatin (LIPITOR) 80 MG tablet TAKE ONE TABLET BY MOUTH ONE TIME DAILY  30 tablet  1  . calcium acetate (PHOSLO) 667 MG capsule Take 1,334 mg by mouth 3 (three) times daily with meals.      . cinacalcet (SENSIPAR) 30 MG tablet Take 30 mg by mouth daily.      Marland Kitchen levothyroxine (SYNTHROID, LEVOTHROID) 50 MCG tablet Take 1 tablet (50 mcg total) by mouth daily.  30 tablet  0  . metoprolol tartrate (LOPRESSOR) 25 MG tablet TAKE ONE HALF TABLET BY MOUTH TWICE DAILY  30 tablet  2  . terazosin (HYTRIN) 2 MG capsule Take 3 capsules by mouth at bedtime.      . nitroGLYCERIN (NITROSTAT) 0.4 MG SL tablet Place 1 tablet (0.4 mg total) under the tongue every 5 (five) minutes as needed for chest pain.  100 tablet  12  No current facility-administered medications for this visit.    BP 140/64  Pulse 56  Ht 5\' 2"  (1.575 m)  Wt 52.617 kg (116 lb)  BMI 21.21 kg/m2 General: NAD Neck: No JVD, no thyromegaly or thyroid nodule.  Lungs: Clear to auscultation bilaterally with normal respiratory effort. CV: Nondisplaced PMI.  Heart regular S1/S2, no S3/S4, 2/6 HSM at apex.  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 No peripheral edema.  Soft L>R carotid bruits.  Normal pedal pulses.  Abdomen: Soft, nontender, no hepatosplenomegaly, no distention.  Neurologic: Alert and oriented x 3.  Psych: Normal affect. Extremities: No clubbing or cyanosis.   Assessment/Plan:  ABDOMINAL AORTIC ANEURYSM Status post AAA stent graft [facility] in 7/13.  She will [facility].  CAD  (coronary artery disease)  S/p inferior STEMI. EF preserved on echo. She has quit smoking. Continue ASA 81, atorvastatin 80, metoprolol. Chest pain in 11/14 was likely noncardiac.   HYPERLIPIDEMIA Continue atorvastatin 80 mg daily, check lipids today.  HTN  BP borderline elevated.  No changes at this time.   ESRD Mrs Louis MeckelStovall is now on dialysis.  [place].  Carotid bruit I will arrange for carotid dopplers.   Marca AnconaDalton McLean 01/12/2013

## 2013-01-12 NOTE — Patient Instructions (Signed)
Your physician recommends that you have a  lipid profile today.  Your physician has requested that you have a carotid duplex. This test is an ultrasound of the carotid arteries in your neck. It looks at blood flow through these arteries that supply the brain with blood. Allow one hour for this exam. There are no restrictions or special instructions.  Your physician wants you to follow-up in: 1 year with Dr Shirlee LatchMcLean. (January 2016).  You will receive a reminder letter in the mail two months in advance. If you don't receive a letter, please call our office to schedule the follow-up appointment.

## 2013-01-19 ENCOUNTER — Encounter (HOSPITAL_COMMUNITY): Payer: Medicare Other

## 2013-01-26 ENCOUNTER — Ambulatory Visit (HOSPITAL_COMMUNITY): Payer: Medicare Other | Attending: Cardiology

## 2013-01-26 ENCOUNTER — Encounter: Payer: Self-pay | Admitting: Cardiology

## 2013-01-26 DIAGNOSIS — F17200 Nicotine dependence, unspecified, uncomplicated: Secondary | ICD-10-CM | POA: Insufficient documentation

## 2013-01-26 DIAGNOSIS — R0989 Other specified symptoms and signs involving the circulatory and respiratory systems: Secondary | ICD-10-CM | POA: Insufficient documentation

## 2013-01-26 DIAGNOSIS — I658 Occlusion and stenosis of other precerebral arteries: Secondary | ICD-10-CM | POA: Insufficient documentation

## 2013-01-26 DIAGNOSIS — E785 Hyperlipidemia, unspecified: Secondary | ICD-10-CM | POA: Insufficient documentation

## 2013-01-26 DIAGNOSIS — J4489 Other specified chronic obstructive pulmonary disease: Secondary | ICD-10-CM | POA: Insufficient documentation

## 2013-01-26 DIAGNOSIS — I2510 Atherosclerotic heart disease of native coronary artery without angina pectoris: Secondary | ICD-10-CM | POA: Insufficient documentation

## 2013-01-26 DIAGNOSIS — I6529 Occlusion and stenosis of unspecified carotid artery: Secondary | ICD-10-CM

## 2013-01-26 DIAGNOSIS — I10 Essential (primary) hypertension: Secondary | ICD-10-CM | POA: Insufficient documentation

## 2013-01-26 DIAGNOSIS — J449 Chronic obstructive pulmonary disease, unspecified: Secondary | ICD-10-CM | POA: Insufficient documentation

## 2013-04-01 ENCOUNTER — Other Ambulatory Visit: Payer: Self-pay | Admitting: Cardiology

## 2013-07-06 ENCOUNTER — Other Ambulatory Visit: Payer: Self-pay | Admitting: Cardiology

## 2013-07-07 ENCOUNTER — Other Ambulatory Visit: Payer: Self-pay

## 2013-07-07 MED ORDER — METOPROLOL TARTRATE 25 MG PO TABS: ORAL_TABLET | ORAL | Status: AC

## 2013-09-29 IMAGING — CR DG CHEST 2V
2 series · 2 of 2 positions shown · non-contrast
Comparison: 02/14/2012

CLINICAL DATA: Preop

CHEST - 2 VIEW

[view not recorded (1 of 2)]
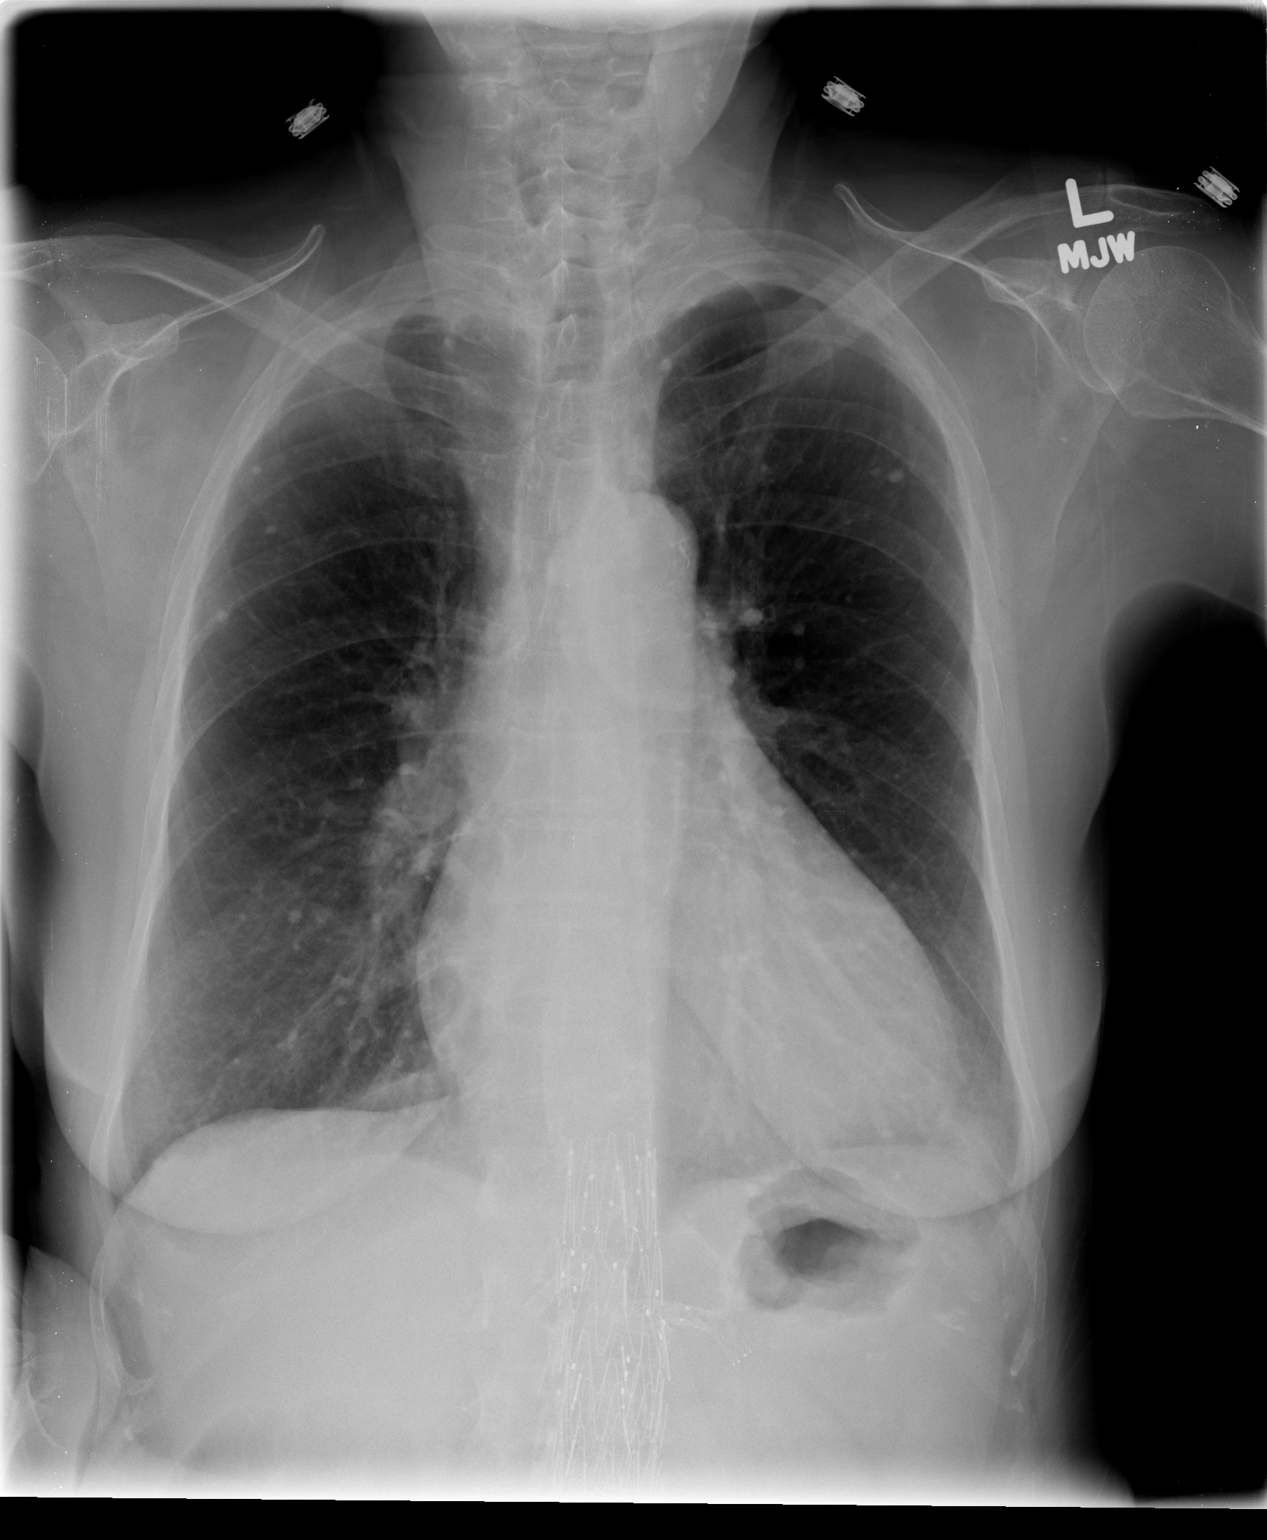

[view not recorded (2 of 2)]
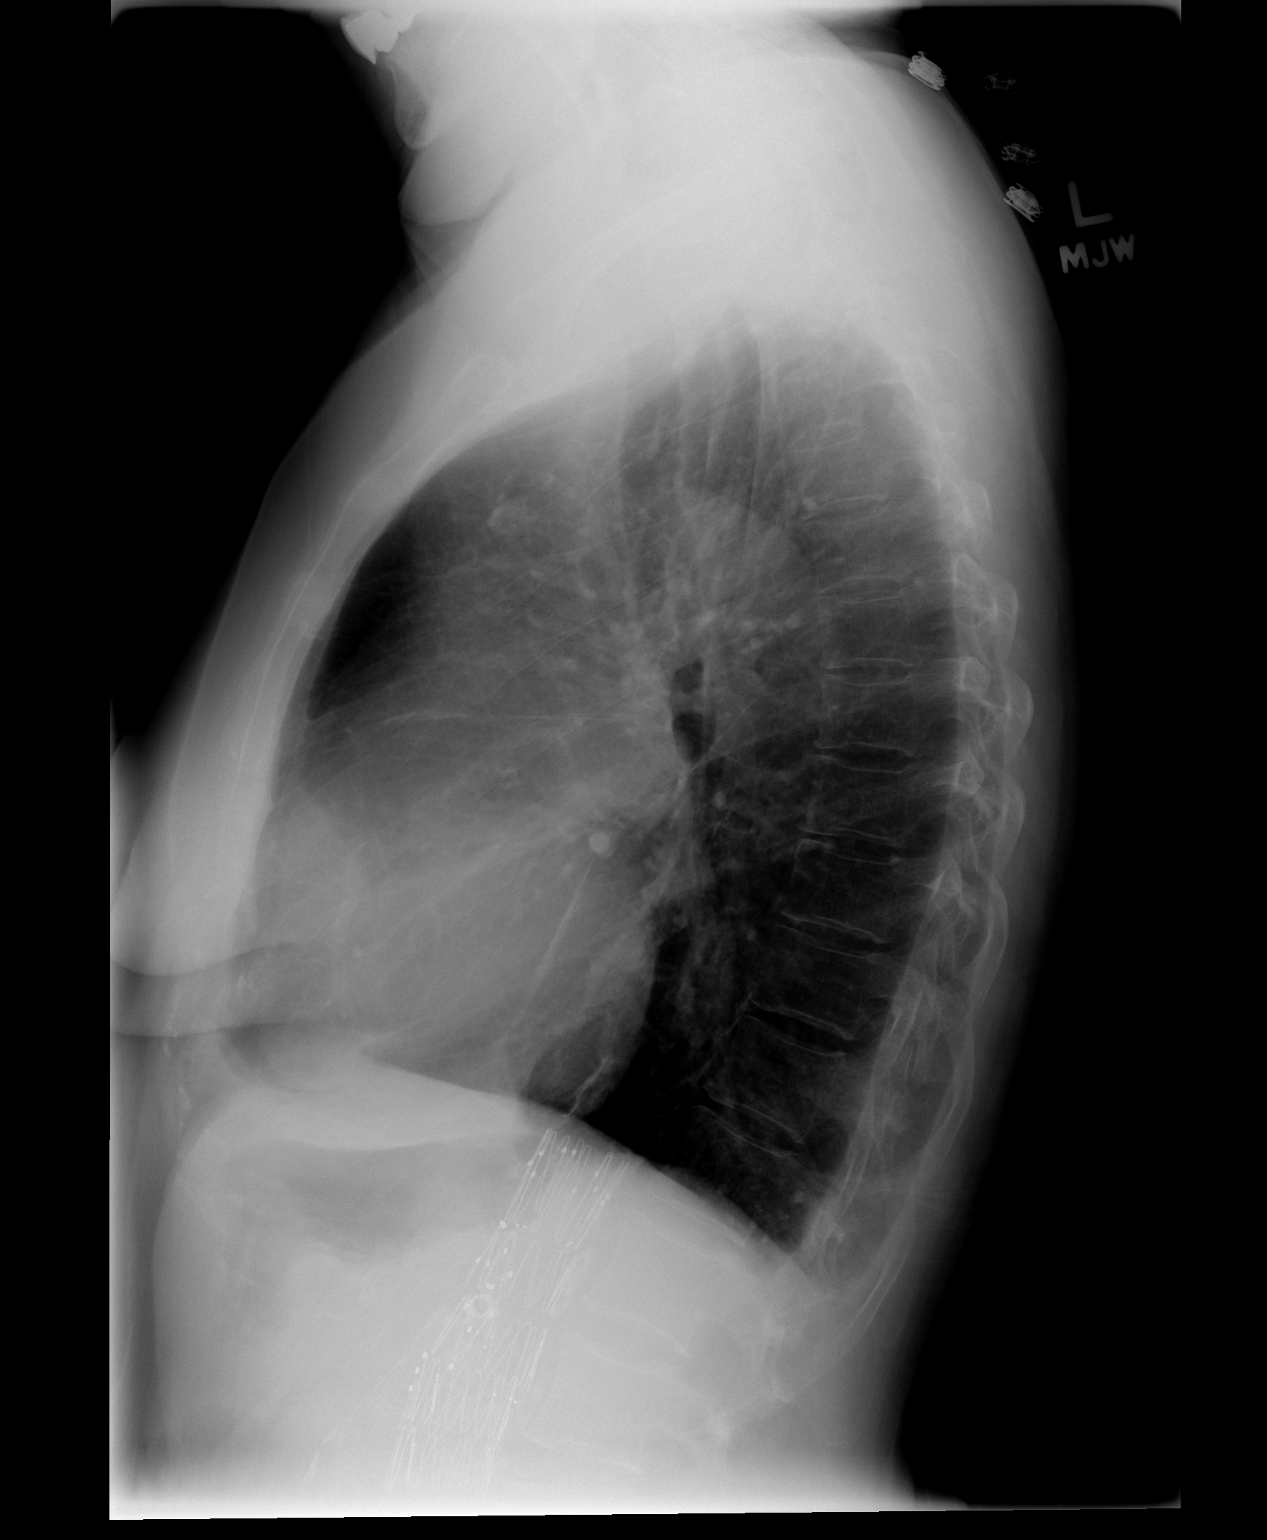

[2 of 2 positions shown; findings below may reference images not displayed]

FINDINGS: Scattered calcified granulomata.  Lungs are otherwise
clear.  No pleural effusion or pneumothorax.

Cardiomegaly.

Lower thoracoabdominal and bilateral renal stents.

Mild degenerative changes of the visualized thoracolumbar spine.
IMPRESSION: No evidence of acute cardiopulmonary disease.

## 2013-10-11 ENCOUNTER — Other Ambulatory Visit: Payer: Self-pay

## 2013-10-11 MED ORDER — ATORVASTATIN CALCIUM 80 MG PO TABS: ORAL_TABLET | ORAL | Status: DC

## 2013-12-15 ENCOUNTER — Encounter (HOSPITAL_COMMUNITY): Payer: Self-pay | Admitting: Interventional Cardiology

## 2014-02-13 ENCOUNTER — Ambulatory Visit: Payer: Medicare Other | Admitting: Cardiology

## 2014-02-15 ENCOUNTER — Encounter: Payer: Self-pay | Admitting: Cardiology

## 2014-02-23 ENCOUNTER — Telehealth (HOSPITAL_COMMUNITY): Payer: Self-pay | Admitting: Cardiology

## 2014-04-07 ENCOUNTER — Other Ambulatory Visit: Payer: Self-pay | Admitting: Cardiology

## 2014-04-14 ENCOUNTER — Other Ambulatory Visit: Payer: Self-pay | Admitting: Cardiology

## 2014-06-16 ENCOUNTER — Other Ambulatory Visit: Payer: Self-pay | Admitting: Cardiology

## 2014-07-17 ENCOUNTER — Telehealth: Payer: Self-pay | Admitting: Family Medicine

## 2014-07-17 NOTE — Telephone Encounter (Signed)
Patient advised that we do not have any new patient appointments until the 2nd week of august. Patient advised that if she needs to be seen before that than she will need to try the urgent care.

## 2014-12-07 DEATH — deceased

## 2015-07-11 ENCOUNTER — Encounter: Payer: Self-pay | Admitting: Gastroenterology
